# Patient Record
Sex: Female | Born: 1945 | Race: White | Hispanic: No | Marital: Married | Smoking: Former smoker
Health system: Southern US, Community
[De-identification: ages and names within clinical notes are randomized; demographics above are authoritative.]

## PROBLEM LIST (undated history)

## (undated) DIAGNOSIS — M1990 Unspecified osteoarthritis, unspecified site: Secondary | ICD-10-CM

## (undated) DIAGNOSIS — F32A Depression, unspecified: Secondary | ICD-10-CM

## (undated) DIAGNOSIS — G709 Myoneural disorder, unspecified: Secondary | ICD-10-CM

## (undated) DIAGNOSIS — F329 Major depressive disorder, single episode, unspecified: Secondary | ICD-10-CM

## (undated) DIAGNOSIS — F419 Anxiety disorder, unspecified: Secondary | ICD-10-CM

## (undated) DIAGNOSIS — R112 Nausea with vomiting, unspecified: Secondary | ICD-10-CM

## (undated) DIAGNOSIS — T148XXA Other injury of unspecified body region, initial encounter: Secondary | ICD-10-CM

## (undated) DIAGNOSIS — G4730 Sleep apnea, unspecified: Secondary | ICD-10-CM

## (undated) DIAGNOSIS — Z9889 Other specified postprocedural states: Secondary | ICD-10-CM

## (undated) DIAGNOSIS — J449 Chronic obstructive pulmonary disease, unspecified: Secondary | ICD-10-CM

## (undated) DIAGNOSIS — J302 Other seasonal allergic rhinitis: Secondary | ICD-10-CM

## (undated) DIAGNOSIS — F0390 Unspecified dementia without behavioral disturbance: Secondary | ICD-10-CM

## (undated) DIAGNOSIS — K219 Gastro-esophageal reflux disease without esophagitis: Secondary | ICD-10-CM

## (undated) DIAGNOSIS — Z23 Encounter for immunization: Secondary | ICD-10-CM

## (undated) DIAGNOSIS — K432 Incisional hernia without obstruction or gangrene: Secondary | ICD-10-CM

## (undated) DIAGNOSIS — I824Z9 Acute embolism and thrombosis of unspecified deep veins of unspecified distal lower extremity: Secondary | ICD-10-CM

## (undated) HISTORY — PX: APPENDECTOMY: SHX54

## (undated) HISTORY — PX: EYE SURGERY: SHX253

## (undated) HISTORY — PX: ABDOMINAL HYSTERECTOMY: SHX81

## (undated) HISTORY — PX: FOOT SURGERY: SHX648

## (undated) HISTORY — DX: Encounter for immunization: Z23

## (undated) HISTORY — DX: Other specified postprocedural states: Z98.890

## (undated) HISTORY — PX: HYSTERECTOMY: SHX81

## (undated) HISTORY — DX: Depression, unspecified: F32.A

## (undated) HISTORY — DX: Other seasonal allergic rhinitis: J30.2

## (undated) HISTORY — DX: Chronic obstructive pulmonary disease, unspecified: J44.9

## (undated) HISTORY — PX: APPENDECTOMY (OPEN): SHX54

## (undated) HISTORY — DX: Nausea with vomiting, unspecified: R11.2

## (undated) HISTORY — PX: COLONOSCOPY: SHX174

## (undated) HISTORY — DX: Unspecified osteoarthritis, unspecified site: M19.90

## (undated) HISTORY — PX: SPINE SURGERY: SHX786

## (undated) HISTORY — PX: HERNIA REPAIR: SHX51

## (undated) HISTORY — PX: OTHER SURGICAL HISTORY: SHX169

## (undated) HISTORY — DX: Acute embolism and thrombosis of unspecified deep veins of unspecified distal lower extremity: I82.4Z9

## (undated) HISTORY — DX: Gastro-esophageal reflux disease without esophagitis: K21.9

## (undated) HISTORY — DX: Unspecified dementia, unspecified severity, without behavioral disturbance, psychotic disturbance, mood disturbance, and anxiety: F03.90

## (undated) HISTORY — DX: Other injury of unspecified body region, initial encounter: T14.8XXA

## (undated) HISTORY — DX: Incisional hernia without obstruction or gangrene: K43.2

## (undated) HISTORY — DX: Sleep apnea, unspecified: G47.30

---

## 2017-04-03 HISTORY — PX: SPINE SURGERY: SHX786

## 2017-08-02 ENCOUNTER — Other Ambulatory Visit (INDEPENDENT_AMBULATORY_CARE_PROVIDER_SITE_OTHER): Payer: Medicare Other

## 2017-08-02 ENCOUNTER — Ambulatory Visit (INDEPENDENT_AMBULATORY_CARE_PROVIDER_SITE_OTHER): Payer: Medicare Other | Admitting: Internal Medicine

## 2017-08-02 ENCOUNTER — Encounter: Payer: Self-pay | Admitting: Internal Medicine

## 2017-08-02 VITALS — BP 118/68 | HR 76 | Ht 63.0 in | Wt 173.4 lb

## 2017-08-02 DIAGNOSIS — R05 Cough: Secondary | ICD-10-CM

## 2017-08-02 DIAGNOSIS — R058 Other specified cough: Secondary | ICD-10-CM

## 2017-08-02 LAB — BASIC METABOLIC PANEL
BUN: 19 mg/dL (ref 6–23)
CO2: 25 mEq/L (ref 19–32)
CREATININE: 1.12 mg/dL (ref 0.40–1.20)
Calcium: 8.8 mg/dL (ref 8.4–10.5)
Chloride: 105 mEq/L (ref 96–112)
GFR: 50.91 mL/min — AB (ref >60.00)
Glucose, Bld: 88 mg/dL (ref 70–99)
Potassium: 3.9 mEq/L (ref 3.5–5.1)
Sodium: 139 mEq/L (ref 135–145)

## 2017-08-02 LAB — CBC WITH DIFFERENTIAL/PLATELET
BASOS PCT: 0.9 % (ref 0.0–3.0)
Basophils Absolute: 0 10*3/uL (ref 0.0–0.1)
Eosinophils Absolute: 0.2 10*3/uL (ref 0.0–0.7)
Eosinophils Relative: 3.8 % (ref 0.0–5.0)
HEMATOCRIT: 40.4 % (ref 36.0–46.0)
Hemoglobin: 13.5 g/dL (ref 12.0–15.0)
LYMPHS PCT: 24 % (ref 12.0–46.0)
Lymphs Abs: 1.1 10*3/uL (ref 0.7–4.0)
MCHC: 33.3 g/dL (ref 30.0–36.0)
MCV: 89.8 fl (ref 78.0–100.0)
MONOS PCT: 8.9 % (ref 3.0–12.0)
Monocytes Absolute: 0.4 10*3/uL (ref 0.1–1.0)
NEUTROS ABS: 3 10*3/uL (ref 1.4–7.7)
Neutrophils Relative %: 62.4 % (ref 43.0–77.0)
PLATELETS: 309 10*3/uL (ref 150.0–400.0)
RBC: 4.5 Mil/uL (ref 3.87–5.11)
RDW: 13.2 % (ref 11.5–15.5)
WBC: 4.8 10*3/uL (ref 4.0–10.5)

## 2017-08-02 MED ORDER — FAMOTIDINE 20 MG PO TABS: ORAL_TABLET | ORAL | 11 refills | Status: DC

## 2017-08-02 MED ORDER — PANTOPRAZOLE SODIUM 40 MG PO TBEC: 40.0000 mg | DELAYED_RELEASE_TABLET | Freq: Every day | ORAL | 2 refills | Status: DC

## 2017-08-02 MED ORDER — PREDNISONE 10 MG PO TABS: ORAL_TABLET | ORAL | 0 refills | Status: DC

## 2017-08-02 MED ORDER — TRAMADOL HCL 50 MG PO TABS: 50.0000 mg | ORAL_TABLET | Freq: Four times a day (QID) | ORAL | 0 refills | Status: DC | PRN

## 2017-08-02 NOTE — Assessment & Plan Note (Signed)
Onset Jan 24/2019 p ET  - Allergy profile 08/02/2017 >  Eos 0. /  IgE    The most common causes of chronic cough in immunocompetent adults include the following: upper airway cough syndrome (UACS), previously referred to as postnasal drip syndrome (PNDS), which is caused by variety of rhinosinus conditions; (2) asthma; (3) GERD; (4) chronic bronchitis from cigarette smoking or other inhaled environmental irritants; (5) nonasthmatic eosinophilic bronchitis; and (6) bronchiectasis.   These conditions, singly or in combination, have accounted for up to 94% of the causes of chronic cough in prospective studies.   Other conditions have constituted no >6% of the causes in prospective studies These have included bronchogenic carcinoma, chronic interstitial pneumonia, sarcoidosis, left ventricular failure, ACEI-induced cough, and aspiration from a condition associated with pharyngeal dysfunction.    Chronic cough is often simultaneously caused by more than one condition. A single cause has been found from 38 to 82% of the time, multiple causes from 18 to 62%. Multiply caused cough has been the result of three diseases up to 42% of the time.       Onset p et c/w Upper airway cough syndrome (previously labeled PNDS),  is so named because it's frequently impossible to sort out how much is  CR/sinusitis with freq throat clearing (which can be related to primary GERD)   vs  causing  secondary (" extra esophageal")  GERD from wide swings in gastric pressure that occur with throat clearing, often  promoting self use of mint and menthol lozenges that reduce the lower esophageal sphincter tone and exacerbate the problem further in a cyclical fashion.   These are the same pts (now being labeled as having "irritable larynx syndrome" by some cough centers) who not infrequently have a history of having failed to tolerate ace inhibitors,  dry powder inhalers or biphosphonates or report having atypical/extraesophageal reflux  symptoms that don't respond to standard doses of PPI  and are easily confused as having aecopd or asthma flares by even experienced allergists/ pulmonologists (myself included).  Of the three most common causes of  Sub-acute / recurrent or chronic cough, only one (GERD)  can actually contribute to/ trigger  the other two (asthma and post nasal drip syndrome)  and perpetuate the cylce of cough.  While not intuitively obvious, many patients with chronic low grade reflux do not cough until there is a primary insult that disturbs the protective epithelial barrier and exposes sensitive nerve endings.   This is typically viral but can due to PNDS or ET tube and  Either of latter two  may apply here.     The point is that once this occurs, it is difficult to eliminate the cycle  using anything but a maximally effective acid suppression regimen at least in the short run, accompanied by an appropriate diet to address non acid GERD and control / eliminate the cough itself for at least 3 days with tramadol and if not better next step is ent eval vs trial of gabapentin.   Total time devoted to counseling  > 50 % of initial 60 min office visit:  review case with pt/ discussion of options/alternatives/ personally creating written customized instructions  in presence of pt  then going over those specific  Instructions directly with the pt including how to use all of the meds but in particular covering each new medication in detail and the difference between the maintenance= "automatic" meds and the prns using an action plan format for the latter (  If this problem/symptom => do that organization reading Left to right).  Please see AVS from this visit for a full list of these instructions which I personally wrote for this pt and  are unique to this visit.

## 2017-08-02 NOTE — Patient Instructions (Addendum)
The key to effective treatment for your cough is eliminating the non-stop cycle of cough you're stuck in long enough to let your airway heal completely and then see if there is anything still making you cough once you stop the cough suppression, but this should take no more than 5 days to figure out  First take delsym two tsp every 12 hours and supplement if needed with  tramadol 50 mg up to 2 every 4 hours to suppress the urge to cough at all or even clear your throat. Swallowing water or using ice chips/non mint and menthol containing candies (such as lifesavers or sugarless jolly ranchers) are also effective.  You should rest your voice and avoid activities that you know make you cough.  Once you have eliminated the cough for 3 straight days try reducing the tramadol first,  then the delsym as tolerated.    Prednisone 10 mg take  4 each am x 2 days,   2 each am x 2 days,  1 each am x 2 days and stop (this is to eliminate allergies and inflammation from coughing)  Protonix (pantoprazole) Take 30-60 min before first meal of the day and Pepcid 20 mg one bedtime plus chlorpheniramine 4 mg x 2 at bedtime (both available over the counter)  until cough is completely gone for at least a week without the need for cough suppression  For drainage / throat tickle try take CHLORPHENIRAMINE  4 mg - take one every 4 hours as needed - available over the counter- may cause drowsiness so start with just a bedtime dose or two and see how you tolerate it before trying in daytime    GERD (REFLUX)  is an extremely common cause of respiratory symptoms, many times with no significant heartburn at all.    It can be treated with medication, but also with lifestyle changes including avoidance of late meals, excessive alcohol, smoking cessation, and avoid fatty foods, chocolate, peppermint, colas, red wine, and acidic juices such as orange juice.  NO MINT OR MENTHOL PRODUCTS SO NO COUGH DROPS   USE HARD CANDY INSTEAD (jolley  ranchers or Stover's or Lifesavers (all available in sugarless versions) NO OIL BASED VITAMINS - use powdered substitutes.   Please remember to go to the lab department downstairs in the basement  for your tests - we will call you with the results when they are available.       Return in 2 weeks if not 100% better.

## 2017-08-02 NOTE — Progress Notes (Signed)
Subjective:     Patient ID: Becky Salazar, female   DOB: [birth year],    MRN: [id number]  HPI    56 yowf  Quit smoking 2006  With onset 50's  Rhinitis not seasonal rx otc s cough/ wheeze s/p surgery on R foot req [place] Apr 26 2017 with throat irritation ever since evolving into 24/7 dry hacking and 6 diffent encounters locally so  Referred herself to pulmonar clinic 08/02/2017       08/02/2017 1st [facility] office visit/ Carrine Kroboth   Chief Complaint  Patient presents with  . Pulmonary Consult    Self referral. Pt c/o cough x 3 months, non prod- occurs am and pm and she has had some trouble sleeping due to cough. She also c/o SOB with exertion such as walking up stairs. She was dxed approx 1 wk ago with DVT and was started on Eliquis.   acute onset cough p et Jan 24/19  rx prednisone/ saba/codeine  - no rx for gerd  Cough is 24/7 with urinary incont and gag but no vomiting  Not provoked by smells/ food/ temp changes or assoc wheeze R foot operation > L DVT one week prior to OV  With neg CTa  Not really sob unless coughing     No  obvious day to day or daytime variability or assoc excess/ purulent sputum or mucus plugs or hemoptysis or cp or chest tightness, subjective wheeze or overt sinus or hb symptoms. No unusual exposure hx or h/o childhood pna/ asthma or knowledge of premature birth.  Also denies any obvious fluctuation of symptoms with weather or environmental changes or other aggravating or alleviating factors except as outlined above   Current Allergies, Complete Past Medical History, Past Surgical History, Family History, and Social History were reviewed in [employer] record.  ROS  The following are not active complaints unless bolded Hoarseness, sore throat, dysphagia, dental problems, itching, sneezing,  nasal congestion or discharge of excess mucus or purulent secretions, ear ache,   fever, chills, sweats, unintended wt loss or wt gain, classically  pleuritic or exertional cp,  orthopnea pnd or leg swelling, presyncope, palpitations, abdominal pain, anorexia, nausea, vomiting, diarrhea  or change in bowel habits or change in bladder habits, change in stools or change in urine, dysuria, hematuria,  rash, arthralgias, visual complaints, headache, numbness, weakness or ataxia or problems with walking or coordination,  change in mood/affect or memory.        Current Meds  Medication Sig  . albuterol (PROVENTIL HFA;VENTOLIN HFA) 108 (90 Base) MCG/ACT inhaler Inhale 2 puffs into the lungs every 6 (six) hours as needed for wheezing or shortness of breath.  Marland Kitchen apixaban (ELIQUIS) 5 MG TABS tablet Take 5 mg by mouth 2 (two) times daily.  . benzonatate (TESSALON) 100 MG capsule Take 100 mg by mouth 3 (three) times daily as needed for cough.  . Cholecalciferol (VITAMIN D) 2000 units CAPS Take 1 capsule by mouth daily.  Marland Kitchen estradiol (ESTRACE) 2 MG tablet Take 2 mg by mouth daily.  . fluticasone (FLONASE) 50 MCG/ACT nasal spray Place 2 sprays into both nostrils daily as needed for allergies or rhinitis.  Marland Kitchen HYDROcodone-homatropine (HYCODAN) 5-1.5 MG/5ML syrup Take 5 mLs by mouth every 6 (six) hours as needed for cough.  . pravastatin (PRAVACHOL) 40 MG tablet Take 40 mg by mouth daily.  . traMADol (ULTRAM) 50 MG tablet Take 50 mg by mouth every 6 (six) hours as needed.  Review of Systems     Objective:   Physical Exam    amb wf with honking cough   Wt Readings from Last 3 Encounters:  08/02/17 173 lb 6.4 oz (78.7 kg)     Vital signs reviewed - Note on arrival 02 sats  95% on RA   HEENT: nl dentition, turbinates bilaterally, and oropharynx. Nl external ear canals without cough reflex   NECK :  without JVD/Nodes/TM/ nl carotid upstrokes bilaterally   LUNGS: no acc muscle use,  Nl contour chest which is clear to A and P bilaterally without cough on insp or exp maneuvers   CV:  RRR  no s3 or murmur or increase in P2, and  LLE   1+ pitting   ABD:  soft and nontender with nl inspiratory excursion in the supine position. No bruits or organomegaly appreciated, bowel sounds nl  MS:  Nl gait/ ext warm without deformities, calf tenderness, cyanosis or clubbing No obvious joint restrictions   SKIN: warm and dry without lesions    NEURO:  alert, approp, nl sensorium with  no motor or cerebellar deficits apparent.       I personally reviewed images and agree with radiology impression as follows:   Chest CTa  07/2917  Nl    Labs ordered 08/02/2017  Allergy profile    Assessment:

## 2017-08-03 LAB — RESPIRATORY ALLERGY PROFILE REGION II ~~LOC~~
Allergen, Cedar tree, t12: <0.1 kU/L
Allergen, D pternoyssinus,d7: <0.1 kU/L
Allergen, Mouse Urine Protein, e78: <0.1 kU/L
Allergen, Mulberry, t76: <0.1 kU/L
Allergen, Oak,t7: <0.1 kU/L
Bermuda Grass: <0.1 kU/L
CLASS: 0
CLASS: 0
CLASS: 0
CLASS: 0
CLASS: 0
CLASS: 0
CLASS: 0
CLASS: 0
CLASS: 0
CLASS: 0
CLASS: 0
Cat Dander: <0.1 kU/L
Class: 0
Class: 0
Class: 0
Class: 0
Class: 0
Class: 0
Class: 0
Class: 0
Class: 0
Class: 0
Class: 0
Class: 0
Class: 0
Cockroach: <0.1 kU/L
D. farinae: <0.1 kU/L
IgE (Immunoglobulin E), Serum: <2 kU/L (ref <=114)
Sheep Sorrel IgE: <0.1 kU/L
Timothy Grass: <0.1 kU/L

## 2017-08-03 LAB — INTERPRETATION:

## 2017-08-17 ENCOUNTER — Ambulatory Visit (INDEPENDENT_AMBULATORY_CARE_PROVIDER_SITE_OTHER): Payer: Medicare Other | Admitting: Internal Medicine

## 2017-08-17 ENCOUNTER — Encounter: Payer: Self-pay | Admitting: Internal Medicine

## 2017-08-17 VITALS — BP 126/80 | HR 97 | Ht 63.0 in | Wt 172.8 lb

## 2017-08-17 DIAGNOSIS — R05 Cough: Secondary | ICD-10-CM | POA: Diagnosis not present

## 2017-08-17 DIAGNOSIS — R058 Other specified cough: Secondary | ICD-10-CM

## 2017-08-17 NOTE — Progress Notes (Signed)
Subjective:     Patient ID: Becky Salazar, female   DOB: Jul 09, 1945,    MRN: [id number]    Brief patient profile:  75 yowf  Quit smoking 2006  With onset 50's  Rhinitis not seasonal rx otc s cough/ wheeze s/p [place] on R foot req [place] Apr 26 2017 with throat irritation ever since evolving into 24/7 dry hacking and 6 diffent encounters locally so  Referred herself to pulmonary clinic 08/02/2017     W/in a month of surgery on R foot 04/26/17 , developed a blood clot L leg > rx eliquis since by PCP with neg CTa since then     08/02/2017 1st [facility] office visit/ Becky Salazar   Chief Complaint  Patient presents with  . Pulmonary Consult    Self referral. Pt c/o cough x 3 months, non prod- occurs am and pm and she has had some trouble sleeping due to cough. She also c/o SOB with exertion such as walking up stairs. She was dxed approx 1 wk ago with DVT and was started on Eliquis.   acute onset cough p et Jan 24 19  rx prednisone/ saba/codeine  - no rx for gerd  Cough is 24/7 with urinary incont and gag but no vomiting  Not provoked by smells/ food/ temp changes or assoc wheeze Not really sob unless coughing  rec   First take delsym two tsp every 12 hours and supplement if needed with  tramadol 50 mg up to 2 every 4 hours to suppress the urge to cough at all or even clear your throat. Once you have eliminated the cough for 3 straight days try reducing the tramadol first,  then the delsym as tolerated.   Prednisone 10 mg take  4 each am x 2 days,   2 each am x 2 days,  1 each am x 2 days and stop (this is to eliminate allergies and inflammation from coughing) Protonix (pantoprazole) Take 30-60 min before first meal of the day and Pepcid 20 mg one bedtime plus chlorpheniramine 4 mg x 2 at bedtime (both available over the counter)  until cough is completely gone for at least a week without the need for cough suppression For drainage / throat tickle try take CHLORPHENIRAMINE  4 mg - take one every 4  hours as needed - GERD (REFLUX) diet    08/17/2017  f/u ov/Becky Salazar re: uacs / cyclical cough p et  Chief Complaint  Patient presents with  . Follow-up    cough and SOB have improved some but have not completely resolved.    Dyspnea:  Doe x across parking lot  About the same time L hip gives out  Cough: overall  much better - worse at just at hs and not taking h1 as rec   Sleep: ok once gets to sleep  SABA :   Using surreptitiously 3 x daily (not sure what it is/ did not disclose last ov)/ finishing medrol for hip pain  Never used more than 2 tramadol in 24 h / admits to using lots of mints against recs on diet restriction    No obvious day to day or daytime variability or assoc excess/ purulent sputum or mucus plugs or hemoptysis or cp or chest tightness, subjective wheeze or overt sinus or hb symptoms. No unusual exposure hx or h/o childhood pna/ asthma or knowledge of premature birth.  Sleeping ok once she can get to sleep  without nocturnal  or early am exacerbation  of  respiratory  c/o's or need for noct saba. Also denies any obvious fluctuation of symptoms with weather or environmental changes or other aggravating or alleviating factors except as outlined above   Current Allergies, Complete Past Medical History, Past Surgical History, Family History, and Social History were reviewed in [employer] record.  ROS  The following are not active complaints unless bolded Hoarseness, sore throat, dysphagia, dental problems, itching, sneezing,  nasal congestion or discharge of excess mucus or purulent secretions, ear ache,   fever, chills, sweats, unintended wt loss or wt gain, classically pleuritic or exertional cp,  orthopnea pnd or arm/hand swelling  or leg swelling, presyncope, palpitations, abdominal pain, anorexia, nausea, vomiting, diarrhea  or change in bowel habits or change in bladder habits, change in stools or change in urine, dysuria, hematuria,  rash, arthralgias  L hip, visual complaints, headache, numbness, weakness or ataxia or problems with walking or coordination,  change in mood or  memory.        Current Meds  Medication Sig  . apixaban (ELIQUIS) 5 MG TABS tablet Take 5 mg by mouth 2 (two) times daily.  . Cholecalciferol (VITAMIN D) 2000 units CAPS Take 1 capsule by mouth daily.  Marland Kitchen estradiol (ESTRACE) 2 MG tablet Take 2 mg by mouth daily.  . famotidine (PEPCID) 20 MG tablet One at bedtime  . methylPREDNISolone (MEDROL DOSEPAK) 4 MG TBPK tablet Take as directed  . pantoprazole (PROTONIX) 40 MG tablet Take 1 tablet (40 mg total) by mouth daily. Take 30-60 min before first meal of the day  . pravastatin (PRAVACHOL) 40 MG tablet Take 40 mg by mouth daily.  . traMADol (ULTRAM) 50 MG tablet Take 1 tablet (50 mg total) by mouth every 6 (six) hours as needed.                     Objective:   Physical Exam     hoarse amb wf nad / no longer honking cough   Wt Readings from Last 3 Encounters:  08/17/17 172 lb 12.8 oz (78.4 kg)  08/02/17 173 lb 6.4 oz (78.7 kg)     Vital signs reviewed - Note on arrival 02 sats  96% on RA          LLE  1+ pitting on L > R  Neg slr    HEENT: nl dentition, turbinates bilaterally, and oropharynx. Nl external ear canals without cough reflex   NECK :  without JVD/Nodes/TM/ nl carotid upstrokes bilaterally   LUNGS: no acc muscle use,  Nl contour chest which is clear to A and P bilaterally without cough on insp or exp maneuvers   CV:  RRR  no s3 or murmur or increase in P2, and  Pitting edema   L >> R LE  ABD:  soft and nontender with nl inspiratory excursion in the supine position. No bruits or organomegaly appreciated, bowel sounds nl  MS:  Nl gait/ ext warm without deformities, calf tenderness, cyanosis or clubbing Neg SLR on L/ some restriction rotation of L hip    SKIN: warm and dry without lesions    NEURO:  alert, approp, nl sensorium with  no motor or cerebellar deficits apparent.            I personally reviewed images and agree with radiology impression as follows:   Chest CTa  07/30/17  Nl        Assessment:

## 2017-08-17 NOTE — Patient Instructions (Addendum)
Finish your medrol dose pack  For drainage / throat tickle/ coughing at bedtime  try take CHLORPHENIRAMINE  4 mg - take one every 4 hours as needed - available over the counter- may cause drowsiness so start with just   dose or two an hour  before bed and see how you tolerate it before trying in daytime    Then if still coughing >  Take delsym two tsp every 12 hours and supplement if needed with  tramadol 50 mg up to 2 every 4 hours to suppress the urge to cough. Swallowing water and/or using ice chips/non mint and menthol containing candies (such as lifesavers or sugarless jolly ranchers) are also effective.  You should rest your voice and avoid activities that you know make you cough.  Once you have eliminated the cough for 3 straight days try reducing the tramadol first,  then the delsym as tolerated.     Stop inhaler and all mint/ menthol / chocolate   If not happy please return with all medications in hand

## 2017-08-18 ENCOUNTER — Encounter: Payer: Self-pay | Admitting: Internal Medicine

## 2017-08-18 NOTE — Assessment & Plan Note (Signed)
Onset Jan 24/2019 p ET  - Allergy profile 08/02/2017 >  Eos 0.2 /  IgE  < 2 RAST neg  Despite multiple errors in following the cyclical cough protocol she has improved x for the cough at hs which is likely related to gerd or pnds and should try 1st gen H1 blockers per guidelines  Before considering additional rx  If not better first needs to return with all meds in hand using a trust but verify approach to confirm accurate Medication  Reconciliation The principal here is that until we are certain that the  patients are doing what we've asked, it makes no sense to ask them to do more.   Pulmonary f/u can be prn

## 2017-12-20 ENCOUNTER — Other Ambulatory Visit: Payer: Self-pay | Admitting: Neurosurgery

## 2017-12-31 NOTE — Pre-Procedure Instructions (Signed)
Becky Salazar  12/31/2017      [facility] - [place], [place] - [address] [place] [address] Phone: [phone] Fax: [phone]    Your procedure is scheduled on Oct. 11  Report to [facility] Admitting at 10:30  A.M.  Call this number if you have problems the morning of surgery:  [phone]   Remember:  Do not eat or drink after midnight.      Take these medicines the morning of surgery with A SIP OF WATER :              Gabapentin (neurontin)             oxycodone if needed              sertraline (zoloft)                  7 days prior to surgery STOP taking any Aspirin(unless otherwise instructed by your surgeon), Aleve, Naproxen, Ibuprofen, Motrin, Advil, Goody's, BC's, all herbal medications, fish oil, and all vitamins    Do not wear jewelry, make-up or nail polish.  Do not wear lotions, powders, or perfumes, or deodorant.  Do not shave 48 hours prior to surgery.  Men may shave face and neck.  Do not bring valuables to the hospital.  [facility] is not responsible for any belongings or valuables.  Contacts, dentures or bridgework may not be worn into surgery.  Leave your suitcase in the car.  After surgery it may be brought to your room.  For patients admitted to the hospital, discharge time will be determined by your treatment team.  Patients discharged the day of surgery will not be allowed to drive home.    Special instructions:  [facility]- Preparing For Surgery  Before surgery, you can play an important role. Because skin is not sterile, your skin needs to be as free of germs as possible. You can reduce the number of germs on your skin by washing with CHG (chlorahexidine gluconate) Soap before surgery.  CHG is an antiseptic cleaner which kills germs and bonds with the skin to continue killing germs even after washing.    Oral Hygiene is also important to reduce your risk of infection.  Remember - BRUSH YOUR  TEETH THE MORNING OF SURGERY WITH YOUR REGULAR TOOTHPASTE  Please do not use if you have an allergy to CHG or antibacterial soaps. If your skin becomes reddened/irritated stop using the CHG.  Do not shave (including legs and underarms) for at least 48 hours prior to first CHG shower. It is OK to shave your face.  Please follow these instructions carefully.   1. Shower the NIGHT BEFORE SURGERY and the MORNING OF SURGERY with CHG.   2. If you chose to wash your hair, wash your hair first as usual with your normal shampoo.  3. After you shampoo, rinse your hair and body thoroughly to remove the shampoo.  4. Use CHG as you would any other liquid soap. You can apply CHG directly to the skin and wash gently with a scrungie or a clean washcloth.   5. Apply the CHG Soap to your body ONLY FROM THE NECK DOWN.  Do not use on open wounds or open sores. Avoid contact with your eyes, ears, mouth and genitals (private parts). Wash Face and genitals (private parts)  with your normal soap.  6. Wash thoroughly, paying special attention to the area where your  surgery will be performed.  7. Thoroughly rinse your body with warm water from the neck down.  8. DO NOT shower/wash with your normal soap after using and rinsing off the CHG Soap.  9. Pat yourself dry with a CLEAN TOWEL.  10. Wear CLEAN PAJAMAS to bed the night before surgery, wear comfortable clothes the morning of surgery  11. Place CLEAN SHEETS on your bed the night of your first shower and DO NOT SLEEP WITH PETS.    Day of Surgery:  Do not apply any deodorants/lotions.  Please wear clean clothes to the hospital/surgery center.   Remember to brush your teeth WITH YOUR REGULAR TOOTHPASTE.    Please read over the following fact sheets that you were given. Coughing and Deep Breathing, MRSA Information and Surgical Site Infection Prevention

## 2018-01-01 ENCOUNTER — Encounter (HOSPITAL_COMMUNITY)
Admission: RE | Admit: 2018-01-01 | Discharge: 2018-01-01 | Disposition: A | Discharge status: Home / Self Care | Payer: Medicare Other | Source: Ambulatory Visit | Attending: Neurosurgery | Admitting: Neurosurgery

## 2018-01-01 ENCOUNTER — Encounter (HOSPITAL_COMMUNITY): Payer: Self-pay

## 2018-01-01 ENCOUNTER — Other Ambulatory Visit: Payer: Self-pay

## 2018-01-01 DIAGNOSIS — Z01818 Encounter for other preprocedural examination: Secondary | ICD-10-CM | POA: Insufficient documentation

## 2018-01-01 DIAGNOSIS — M48062 Spinal stenosis, lumbar region with neurogenic claudication: Secondary | ICD-10-CM | POA: Insufficient documentation

## 2018-01-01 HISTORY — DX: Anxiety disorder, unspecified: F41.9

## 2018-01-01 HISTORY — DX: Unspecified osteoarthritis, unspecified site: M19.90

## 2018-01-01 HISTORY — DX: Major depressive disorder, single episode, unspecified: F32.9

## 2018-01-01 HISTORY — DX: Myoneural disorder, unspecified: G70.9

## 2018-01-01 HISTORY — DX: Depression, unspecified: F32.A

## 2018-01-01 LAB — CBC
HCT: 42.9 % (ref 36.0–46.0)
Hemoglobin: 13.5 g/dL (ref 12.0–15.0)
MCH: 29.7 pg (ref 26.0–34.0)
MCHC: 31.5 g/dL (ref 30.0–36.0)
MCV: 94.5 fL (ref 78.0–100.0)
Platelets: 229 10*3/uL (ref 150–400)
RBC: 4.54 MIL/uL (ref 3.87–5.11)
RDW: 13.9 % (ref 11.5–15.5)
WBC: 6.8 10*3/uL (ref 4.0–10.5)

## 2018-01-01 LAB — TYPE AND SCREEN
ABO/RH(D): A POS
Antibody Screen: NEGATIVE

## 2018-01-01 LAB — PROTIME-INR
INR: 1.14
Prothrombin Time: 14.5 seconds (ref 11.4–15.2)

## 2018-01-01 LAB — SURGICAL PCR SCREEN
MRSA, PCR: NEGATIVE
Staphylococcus aureus: NEGATIVE

## 2018-01-01 LAB — BASIC METABOLIC PANEL
Anion gap: 10 (ref 5–15)
BUN: 25 mg/dL — AB (ref 8–23)
CALCIUM: 9.8 mg/dL (ref 8.9–10.3)
CO2: 27 mmol/L (ref 22–32)
CREATININE: 1.13 mg/dL — AB (ref 0.44–1.00)
Chloride: 105 mmol/L (ref 98–111)
GFR calc non Af Amer: 48 mL/min — ABNORMAL LOW (ref >60)
GFR, EST AFRICAN AMERICAN: 55 mL/min — AB (ref >60)
GLUCOSE: 102 mg/dL — AB (ref 70–99)
Potassium: 4.6 mmol/L (ref 3.5–5.1)
Sodium: 142 mmol/L (ref 135–145)

## 2018-01-01 LAB — ABO/RH: ABO/RH(D): A POS

## 2018-01-01 NOTE — Progress Notes (Addendum)
PCP: Faythe Casa, MD  Cardiologist: pt denies -has had cardiac studies done at [facility] at [place]  EKG: requested  Stress test: requested  ECHO: pt denies  Cardiac Cath: pt denies  Chest x-ray: pt denies past year, no recent respiratory infections  Eliquis-last dose 01/07/18 per MD instructions

## 2018-01-02 NOTE — Progress Notes (Signed)
Anesthesia Chart Review:  Case:  [id number] Date/Time:  01/11/18 1214   Procedure:  Left Lumbar 2-3 Anterolateral lumbar interbody fusion with lateral plate (Left ) - Left Lumbar 2-3 Anterolateral lumbar interbody fusion with lateral plate   Anesthesia type:  General   Pre-op diagnosis:  Lumbar stenosis with neurogenic claudication   Location:  [facility] OR ROOM 21 / [facility] OR   Surgeon:  Maeola Harman, MD      DISCUSSION: 72 yo female former smoker. 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  Pt has cardiac clearance from Dr. Christoper Allegra 12/28/2017, copy on pt chart. Per his note "patient is low cardiac risk for the lumbar surgery.  She may proceed to the surgery without any further evaluation.  She may discontinue her anticoagulation 3 days prior to the procedure and resume 2 days after.  Patient has risk for postoperative DVTs given her current DVT in her left femoral vein... Patient has also underwent a recent nuclear medicine stress test for foot procedure.  This has returned negative.  She denies having any recent chest pain shortness of breath.  She has no personal history of heart disease, peripheral arterial disease, or stroke.  Her kidney function is borderline stage II-III.  Her last creatinine was returned back at 1.0.  Patient performed less than 4 METS due to pain in her back and left hip."  Anticipate she can proceed as planned barring acute status change.  VS: BP 123/75   Pulse 71   Temp 36.5 C   Resp 20   Ht 5\' 3"  (1.6 m)   Wt 80.2 kg   SpO2 100%   BMI 31.32 kg/m   PROVIDERS: System, Pcp Not In  Christoper Allegra, MD is Cardiologist with [facility] in [place], last seen 12/28/2017.  LABS: Labs reviewed: Acceptable for surgery. (all labs ordered are listed, but only abnormal results are displayed)  Labs Reviewed  BASIC METABOLIC PANEL - Abnormal; Notable for the following components:   Result Value   Glucose, Bld 102 (*)    BUN 25 (*)    Creatinine, Ser 1.13 (*)    GFR calc non Af Amer 48 (*)    GFR calc Af Amer 55 (*)    All other components within normal limits  SURGICAL PCR SCREEN  CBC  PROTIME-INR  TYPE AND SCREEN  ABO/RH     IMAGES: CTA chest 07/30/2017: Impression: 1.  No pulmonary embolus. 2.  Small pericardial effusion. 3.  Right thyroid nodule, cystic measuring 3.7 cm. 4.  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   EKG: Requested, if not received will need on DOS   CV:  05/03/2011 (outside record, copy on pt chart): Summary: 1.  Stress ECG conclusions: Stress ECG is negative for ischemia. 2.  Myocardial perfusion imaging: No myocardial perfusion defects noted. 3.  Gated SPECT: The cochlea left ventricular ejection fraction 70%.  Past Medical History:  Diagnosis Date  . Anxiety   . Arthritis   . Depression   . Neuromuscular disorder [facility])     Past Surgical History:  Procedure Laterality Date  . ABDOMINAL HYSTERECTOMY    . APPENDECTOMY    . EYE SURGERY Right    cataract sugery  . FOOT SURGERY     x3-straightening toes    MEDICATIONS: . amitriptyline (ELAVIL) 25 MG tablet  . apixaban (ELIQUIS) 5 MG TABS tablet  . Cholecalciferol (VITAMIN D) 2000 units CAPS  .  donepezil (ARICEPT) 5 MG tablet  . gabapentin (NEURONTIN) 400 MG capsule  . oxyCODONE-acetaminophen (PERCOCET/ROXICET) 5-325 MG tablet  . pravastatin (PRAVACHOL) 40 MG tablet  . sertraline (ZOLOFT) 50 MG tablet  . vitamin B-12 (CYANOCOBALAMIN) 1000 MCG tablet   No current facility-administered medications for this encounter.      Zannie Cove [facility] Short Stay Center/Anesthesiology Phone [phone] 01/04/2018 4:10 PM

## 2018-01-08 NOTE — H&P (Signed)
Patient ID:   [id number] Patient: Becky Salazar  Date of Birth: 03/08/1946 Visit Type: Office Visit   Date: 12/14/2017 02:15 PM Provider: Danae Orleans. Venetia Maxon MD   This 72 year old female presents for back pain.  HISTORY OF PRESENT ILLNESS:  1.  back pain  Becky Salazar, 73 year old retired female and wife of patient Becky Salazar, visits for evaluation.  She reports increasing low back pain since January.  She recalls no injury.  She was apparently diagnosed with a left leg DVT 3 months ago and placed on Eliquis.  She does not recall any symptoms prompting evaluation for DVT; but is scheduled to follow-up with that physician soon.  Gabapentin 400 mg t.i.d. Oxycodone 5 mg typically once per day  Physical therapy x3 courses offered no relief ESI's x2 offered no relief  History:  Left leg DVT 3 months ago, arthritis, depression Surgical history:  Appendectomy 1954, hysterectomy 1994, eye cancer, sacral nerve stimulator placed approximately 10 years ago, repositioned and IPG changed approximately 5 years ago.  CT and x-rays on [place]          PAST MEDICAL/SURGICAL HISTORY:   (Detailed)       PAST MEDICAL HISTORY, SURGICAL HISTORY, FAMILY HISTORY, SOCIAL HISTORY AND REVIEW OF SYSTEMS I have reviewed the patient's past medical, surgical, family and social history as well as the comprehensive review of systems as included on the [facility] history form dated 12/14/2017, which I have signed.  Family History:  (Detailed)   Social History:  (Detailed) Tobacco use reviewed. Preferred language is [place].   Tobacco use status: Current non-smoker. Smoking status: Never smoker.  SMOKING STATUS Type Smoking Status Usage Per Day Years Used Total Pack Years   Never smoker      TOBACCO/VAPING EXPOSURE No passive vaping exposure. No passive smoke exposure.       MEDICATIONS: (added, continued or stopped this visit) Started Medication  Directions Instruction Stopped  12/14/2017 Percocet 5 mg-325 mg tablet take 1 tablet by oral route every 4-6 hours prn pain       ALLERGIES: Ingredient Reaction Medication Name Comment  0.225 % SODIUM CHLORIDE  Tobi   TOBRAMYCIN  Tobi   PENICILLIN      Reviewed, updated.   REVIEW OF SYSTEMS   See scanned patient registration form, dated 12/14/2017, signed and dated on 12/15/2017  Review of Systems Details System Neg/Pos Details  Constitutional Negative Chills, Fatigue, Fever, Malaise, Night sweats, Weight gain and Weight loss.  ENMT Negative Ear drainage, Hearing loss, Nasal drainage, Otalgia, Sinus pressure and Sore throat.  Eyes Negative Eye discharge, Eye pain and Vision changes.  Respiratory Negative Chronic cough, Cough, Dyspnea, Known TB exposure and Wheezing.  Cardio Negative Chest pain, Claudication, Edema and Irregular heartbeat/palpitations.  GI Negative Abdominal pain, Blood in stool, Change in stool pattern, Constipation, Decreased appetite, Diarrhea, Heartburn, Nausea and Vomiting.  GU Negative Dysuria, Hematuria, Polyuria (Genitourinary), Urinary frequency, Urinary incontinence and Urinary retention.  Endocrine Negative Cold intolerance, Heat intolerance, Polydipsia and Polyphagia.  Neuro Negative Dizziness, Extremity weakness, Gait disturbance, Headache, Memory impairment, Numbness in extremity, Seizures and Tremors.  Psych Negative Anxiety, Depression and Insomnia.  Integumentary Negative Brittle hair, Brittle nails, Change in shape/size of mole(s), Hair loss, Hirsutism, Hives, Pruritus, Rash and Skin lesion.  MS Positive Back pain.  Hema/Lymph Negative Easy bleeding, Easy bruising and Lymphadenopathy.  Allergic/Immuno Negative Contact allergy, Environmental allergies, Food allergies and Seasonal allergies.  Reproductive Negative Breast discharge, Breast lumps, Dysmenorrhea, Dyspareunia, History of  abnormal PAP smear, Hot flashes, Irregular menses and Vaginal  discharge.   PHYSICAL EXAM:   Vitals Date Temp F BP Pulse Ht In Wt Lb BMI BSA Pain Score  12/14/2017  111/81 81 63 172 30.47  4/10    PHYSICAL EXAM Details General Level of Distress: no acute distress Overall Appearance: normal  Head and Face  Right Left  Fundoscopic Exam:  normal normal    Cardiovascular Cardiac: regular rate and rhythm without murmur  Right Left  Carotid Pulses: normal normal  Respiratory Lungs: clear to auscultation  Neurological Orientation: normal Recent and Remote Memory: normal Attention Span and Concentration:   normal Language: normal Fund of Knowledge: normal  Right Left Sensation: normal normal Upper Extremity Coordination: normal normal  Lower Extremity Coordination: normal normal  Musculoskeletal Gait and Station: normal  Right Left Upper Extremity Muscle Strength: normal normal Lower Extremity Muscle Strength: normal normal Upper Extremity Muscle Tone:  normal normal Lower Extremity Muscle Tone: normal normal   Motor Strength Upper and lower extremity motor strength was tested in the clinically pertinent muscles. Any abnormal findings will be noted below.   Right Left Hip Flexor:  4/5 Knee Extensor:  4/5   Deep Tendon Reflexes  Right Left Biceps: normal normal Triceps: normal normal Brachioradialis: normal normal Patellar: normal normal Achilles: normal normal  Sensory Sensation was tested at L1 to S1.   Cranial Nerves II. Optic Nerve/Visual Fields: normal III. Oculomotor: normal IV. Trochlear: normal V. Trigeminal: normal VI. Abducens: normal VII. Facial: normal VIII. Acoustic/Vestibular: normal IX. Glossopharyngeal: normal X. Vagus: normal XI. Spinal Accessory: normal XII. Hypoglossal: normal  Motor and other Tests Lhermittes: negative Rhomberg: negative Pronator drift: absent     Right Left Hoffman's: normal normal Clonus: normal normal Babinski: normal normal SLR: negative positive at 30  degrees Patrick's Pearlean Brownie): negative negative Toe Walk: normal normal Toe Lift: normal normal Heel Walk: normal normal SI Joint: nontender nontender   Additional Findings:  Deformity of right foot 2nd toe after surgery.  Left sciatic notch discomfort and left leg pain radiating into her left upper thigh.    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  She has weakness in left hip flexors at 4/5 and left quadriceps at 4/5 and is having continuous and unrelenting pain into her left leg with positive straight leg raise.  Comments:  Patient will need surgical clearance and need to stop Eliquis (Dr. Brent Bulla)  PLAN:  The patient wants to get some relief.  I have suggested left-sided L2-3 XL IF with lateral plate at cone.  She will need to be cleared medically and her DVT to be resolved and for her to come off Eliquis before proceeding with surgery.  Risks and benefits were discussed in detail with the patient and she wishes to proceed.  Orders: Diagnostic Procedures: Assessment Procedure  M54.16 Lumbar Spine- AP/Lat  M54.16 Lumbar Spine- AP/Lat/Flex/Ex  Miscellaneous: Assessment   M48.062 LSO Brace   Completed Orders (this encounter) Order Details Reason Side Interpretation Result Initial Treatment Date Region  Lumbar Spine- AP/Lat/Flex/Ex      12/14/2017 All Levels to All Levels   Assessment/Plan   # Detail Type Description   1. Assessment Disc displacement, lumbar (M51.26).       2. 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       3. Assessment Low back pain,  unspecified back pain laterality, with sciatica presence unspecified (M54.5).       4. Assessment Spondylolisthesis, lumbar region (M43.16).       5. Assessment Lumbar radiculopathy (M54.16).       6. Assessment Lumbar stenosis with neurogenic claudication  (M48.062).   Plan Orders LSO Brace. Clinical information/comments: gave paper order.         Pain Management Plan Pain Scale: 4/10. Method: Numeric Pain Intensity Scale. Location: back. Onset: 04/15/2017. Duration: varies. Quality: discomforting. Pain management follow-up plan of care: Patient taken medication as prescribed.     MEDICATIONS PRESCRIBED TODAY    Rx Quantity Refills  PERCOCET 5 mg-325 mg  60 0            Provider:  Danae Orleans. Venetia Maxon MD  12/15/2017 04:12 PM Dictation edited by: Danae Orleans. Venetia Maxon    CC Providers: Maeola Harman MD  [address] [place], [place] [address]               Electronically signed by Danae Orleans. Venetia Maxon MD on 12/15/2017 04:12 PM

## 2018-01-11 ENCOUNTER — Inpatient Hospital Stay (HOSPITAL_COMMUNITY): Payer: Medicare Other

## 2018-01-11 ENCOUNTER — Inpatient Hospital Stay (HOSPITAL_COMMUNITY)
Admission: RE | Admit: 2018-01-11 | Discharge: 2018-01-13 | DRG: 460 | Disposition: A | Discharge status: Home / Self Care | Payer: Medicare Other | Attending: Neurosurgery | Admitting: Neurosurgery

## 2018-01-11 ENCOUNTER — Encounter (HOSPITAL_COMMUNITY): Payer: Self-pay | Admitting: Certified Registered Nurse Anesthetist

## 2018-01-11 ENCOUNTER — Inpatient Hospital Stay (HOSPITAL_COMMUNITY): Payer: Medicare Other | Admitting: Physician Assistant

## 2018-01-11 ENCOUNTER — Encounter (HOSPITAL_COMMUNITY)
Admission: RE | Disposition: A | Discharge status: Home / Self Care | Payer: Self-pay | Source: Home / Self Care | Attending: Neurosurgery

## 2018-01-11 ENCOUNTER — Other Ambulatory Visit: Payer: Self-pay

## 2018-01-11 ENCOUNTER — Inpatient Hospital Stay (HOSPITAL_COMMUNITY): Payer: Medicare Other | Admitting: Certified Registered Nurse Anesthetist

## 2018-01-11 DIAGNOSIS — M1990 Unspecified osteoarthritis, unspecified site: Secondary | ICD-10-CM | POA: Diagnosis present

## 2018-01-11 DIAGNOSIS — Z85840 Personal history of malignant neoplasm of eye: Secondary | ICD-10-CM

## 2018-01-11 DIAGNOSIS — Z86718 Personal history of other venous thrombosis and embolism: Secondary | ICD-10-CM | POA: Diagnosis not present

## 2018-01-11 DIAGNOSIS — F329 Major depressive disorder, single episode, unspecified: Secondary | ICD-10-CM | POA: Diagnosis present

## 2018-01-11 DIAGNOSIS — M419 Scoliosis, unspecified: Secondary | ICD-10-CM | POA: Diagnosis present

## 2018-01-11 DIAGNOSIS — Z9682 Presence of neurostimulator: Secondary | ICD-10-CM

## 2018-01-11 DIAGNOSIS — Z881 Allergy status to other antibiotic agents status: Secondary | ICD-10-CM | POA: Diagnosis not present

## 2018-01-11 DIAGNOSIS — M4126 Other idiopathic scoliosis, lumbar region: Secondary | ICD-10-CM | POA: Diagnosis present

## 2018-01-11 DIAGNOSIS — M4316 Spondylolisthesis, lumbar region: Secondary | ICD-10-CM | POA: Diagnosis present

## 2018-01-11 DIAGNOSIS — Z90710 Acquired absence of both cervix and uterus: Secondary | ICD-10-CM | POA: Diagnosis not present

## 2018-01-11 DIAGNOSIS — M48062 Spinal stenosis, lumbar region with neurogenic claudication: Principal | ICD-10-CM | POA: Diagnosis present

## 2018-01-11 DIAGNOSIS — M4726 Other spondylosis with radiculopathy, lumbar region: Secondary | ICD-10-CM | POA: Diagnosis present

## 2018-01-11 DIAGNOSIS — Z87891 Personal history of nicotine dependence: Secondary | ICD-10-CM

## 2018-01-11 DIAGNOSIS — Z880 Allergy status to penicillin: Secondary | ICD-10-CM

## 2018-01-11 HISTORY — PX: ANTERIOR LAT LUMBAR FUSION: SHX1168

## 2018-01-11 SURGERY — ANTERIOR LATERAL LUMBAR FUSION 1 LEVEL
Anesthesia: General | Laterality: Left

## 2018-01-11 MED ORDER — SUCCINYLCHOLINE CHLORIDE 200 MG/10ML IV SOSY
PREFILLED_SYRINGE | INTRAVENOUS | Status: DC | PRN
  Administered 2018-01-11: 40 mg via INTRAVENOUS

## 2018-01-11 MED ORDER — ACETAMINOPHEN 650 MG RE SUPP: 650.0000 mg | RECTAL | Status: DC | PRN

## 2018-01-11 MED ORDER — VANCOMYCIN HCL IN DEXTROSE 1-5 GM/200ML-% IV SOLN
1000.0000 mg | Freq: Once | INTRAVENOUS | Status: AC
  Administered 2018-01-11: 1000 mg via INTRAVENOUS
  Filled 2018-01-11: qty 200

## 2018-01-11 MED ORDER — MIDAZOLAM HCL 2 MG/2ML IJ SOLN
INTRAMUSCULAR | Status: AC
  Filled 2018-01-11: qty 2

## 2018-01-11 MED ORDER — DEXAMETHASONE SODIUM PHOSPHATE 10 MG/ML IJ SOLN
INTRAMUSCULAR | Status: DC | PRN
  Administered 2018-01-11: 10 mg via INTRAVENOUS

## 2018-01-11 MED ORDER — PANTOPRAZOLE SODIUM 40 MG PO TBEC
40.0000 mg | DELAYED_RELEASE_TABLET | Freq: Every day | ORAL | Status: DC
  Administered 2018-01-11 – 2018-01-13 (×3): 40 mg via ORAL
  Filled 2018-01-11 (×4): qty 1

## 2018-01-11 MED ORDER — OXYCODONE-ACETAMINOPHEN 5-325 MG PO TABS
1.0000 | ORAL_TABLET | ORAL | Status: DC | PRN
  Administered 2018-01-11 – 2018-01-12 (×3): 1 via ORAL
  Filled 2018-01-11 (×2): qty 1

## 2018-01-11 MED ORDER — GABAPENTIN 400 MG PO CAPS
400.0000 mg | ORAL_CAPSULE | Freq: Two times a day (BID) | ORAL | Status: DC
  Administered 2018-01-11 – 2018-01-13 (×4): 400 mg via ORAL
  Filled 2018-01-11 (×4): qty 1

## 2018-01-11 MED ORDER — CHLORHEXIDINE GLUCONATE CLOTH 2 % EX PADS: 6.0000 | MEDICATED_PAD | Freq: Once | CUTANEOUS | Status: DC

## 2018-01-11 MED ORDER — LACTATED RINGERS IV SOLN
INTRAVENOUS | Status: DC
  Administered 2018-01-11: 11:00:00 via INTRAVENOUS

## 2018-01-11 MED ORDER — EPHEDRINE SULFATE-NACL 50-0.9 MG/10ML-% IV SOSY
PREFILLED_SYRINGE | INTRAVENOUS | Status: DC | PRN
  Administered 2018-01-11: 15 mg via INTRAVENOUS

## 2018-01-11 MED ORDER — ACETAMINOPHEN 325 MG PO TABS
650.0000 mg | ORAL_TABLET | ORAL | Status: DC | PRN
  Administered 2018-01-12: 650 mg via ORAL
  Filled 2018-01-11: qty 2

## 2018-01-11 MED ORDER — FENTANYL CITRATE (PF) 250 MCG/5ML IJ SOLN
INTRAMUSCULAR | Status: DC | PRN
  Administered 2018-01-11 (×2): 50 ug via INTRAVENOUS
  Administered 2018-01-11: 100 ug via INTRAVENOUS
  Administered 2018-01-11 (×3): 50 ug via INTRAVENOUS

## 2018-01-11 MED ORDER — SODIUM CHLORIDE 0.9% FLUSH
3.0000 mL | Freq: Two times a day (BID) | INTRAVENOUS | Status: DC
  Administered 2018-01-12 – 2018-01-13 (×3): 3 mL via INTRAVENOUS

## 2018-01-11 MED ORDER — VANCOMYCIN HCL IN DEXTROSE 1-5 GM/200ML-% IV SOLN
1000.0000 mg | INTRAVENOUS | Status: AC
  Administered 2018-01-11: 1000 mg via INTRAVENOUS

## 2018-01-11 MED ORDER — MORPHINE SULFATE (PF) 2 MG/ML IV SOLN
2.0000 mg | INTRAVENOUS | Status: DC | PRN
  Administered 2018-01-11 (×2): 2 mg via INTRAVENOUS
  Filled 2018-01-11 (×2): qty 1

## 2018-01-11 MED ORDER — THROMBIN 5000 UNITS EX SOLR
CUTANEOUS | Status: AC
  Filled 2018-01-11: qty 5000

## 2018-01-11 MED ORDER — MENTHOL 3 MG MT LOZG: 1.0000 | LOZENGE | OROMUCOSAL | Status: DC | PRN

## 2018-01-11 MED ORDER — OXYCODONE HCL 5 MG PO TABS
5.0000 mg | ORAL_TABLET | ORAL | Status: DC | PRN
  Administered 2018-01-11 – 2018-01-12 (×3): 5 mg via ORAL
  Filled 2018-01-11 (×3): qty 1

## 2018-01-11 MED ORDER — OXYCODONE-ACETAMINOPHEN 5-325 MG PO TABS
ORAL_TABLET | ORAL | Status: AC
  Filled 2018-01-11: qty 2

## 2018-01-11 MED ORDER — POLYETHYLENE GLYCOL 3350 17 G PO PACK
17.0000 g | PACK | Freq: Every day | ORAL | Status: DC | PRN
  Administered 2018-01-12: 17 g via ORAL
  Filled 2018-01-11: qty 1

## 2018-01-11 MED ORDER — HYDROXYZINE HCL 50 MG/ML IM SOLN
50.0000 mg | Freq: Four times a day (QID) | INTRAMUSCULAR | Status: DC | PRN
  Administered 2018-01-11: 50 mg via INTRAMUSCULAR
  Filled 2018-01-11: qty 1

## 2018-01-11 MED ORDER — AMITRIPTYLINE HCL 25 MG PO TABS
25.0000 mg | ORAL_TABLET | Freq: Every day | ORAL | Status: DC
  Administered 2018-01-11 – 2018-01-12 (×2): 25 mg via ORAL
  Filled 2018-01-11 (×2): qty 1

## 2018-01-11 MED ORDER — LIDOCAINE-EPINEPHRINE 1 %-1:100000 IJ SOLN
INTRAMUSCULAR | Status: AC
  Filled 2018-01-11: qty 1

## 2018-01-11 MED ORDER — FENTANYL CITRATE (PF) 100 MCG/2ML IJ SOLN
INTRAMUSCULAR | Status: AC
  Filled 2018-01-11: qty 2

## 2018-01-11 MED ORDER — FLEET ENEMA 7-19 GM/118ML RE ENEM: 1.0000 | ENEMA | Freq: Once | RECTAL | Status: DC | PRN

## 2018-01-11 MED ORDER — ONDANSETRON HCL 4 MG/2ML IJ SOLN: 4.0000 mg | Freq: Four times a day (QID) | INTRAMUSCULAR | Status: DC | PRN

## 2018-01-11 MED ORDER — ZOLPIDEM TARTRATE 5 MG PO TABS: 5.0000 mg | ORAL_TABLET | Freq: Every evening | ORAL | Status: DC | PRN

## 2018-01-11 MED ORDER — KCL IN DEXTROSE-NACL 20-5-0.45 MEQ/L-%-% IV SOLN
INTRAVENOUS | Status: DC
  Administered 2018-01-11: 23:00:00 via INTRAVENOUS
  Filled 2018-01-11: qty 1000

## 2018-01-11 MED ORDER — LIDOCAINE 2% (20 MG/ML) 5 ML SYRINGE
INTRAMUSCULAR | Status: DC | PRN
  Administered 2018-01-11: 80 mg via INTRAVENOUS

## 2018-01-11 MED ORDER — BISACODYL 10 MG RE SUPP
10.0000 mg | Freq: Every day | RECTAL | Status: DC | PRN
  Administered 2018-01-12: 10 mg via RECTAL
  Filled 2018-01-11: qty 1

## 2018-01-11 MED ORDER — VITAMIN D3 25 MCG (1000 UNIT) PO TABS
2000.0000 [IU] | ORAL_TABLET | Freq: Every day | ORAL | Status: DC
  Administered 2018-01-11 – 2018-01-13 (×3): 2000 [IU] via ORAL
  Filled 2018-01-11 (×5): qty 2

## 2018-01-11 MED ORDER — PHENOL 1.4 % MT LIQD: 1.0000 | OROMUCOSAL | Status: DC | PRN

## 2018-01-11 MED ORDER — PROPOFOL 10 MG/ML IV BOLUS
INTRAVENOUS | Status: DC | PRN
  Administered 2018-01-11: 200 mg via INTRAVENOUS

## 2018-01-11 MED ORDER — METHOCARBAMOL 1000 MG/10ML IJ SOLN
500.0000 mg | Freq: Four times a day (QID) | INTRAVENOUS | Status: DC | PRN
  Filled 2018-01-11: qty 5

## 2018-01-11 MED ORDER — SERTRALINE HCL 50 MG PO TABS
50.0000 mg | ORAL_TABLET | Freq: Every day | ORAL | Status: DC
  Administered 2018-01-12 – 2018-01-13 (×2): 50 mg via ORAL
  Filled 2018-01-11 (×2): qty 1

## 2018-01-11 MED ORDER — PRAVASTATIN SODIUM 40 MG PO TABS
40.0000 mg | ORAL_TABLET | Freq: Every day | ORAL | Status: DC
  Administered 2018-01-11 – 2018-01-13 (×3): 40 mg via ORAL
  Filled 2018-01-11 (×3): qty 1

## 2018-01-11 MED ORDER — SODIUM CHLORIDE 0.9 % IV SOLN
INTRAVENOUS | Status: DC | PRN
  Administered 2018-01-11: 20 ug/min via INTRAVENOUS

## 2018-01-11 MED ORDER — METHOCARBAMOL 500 MG PO TABS
500.0000 mg | ORAL_TABLET | Freq: Four times a day (QID) | ORAL | Status: DC | PRN
  Administered 2018-01-11 – 2018-01-12 (×3): 500 mg via ORAL
  Filled 2018-01-11 (×2): qty 1

## 2018-01-11 MED ORDER — DOCUSATE SODIUM 100 MG PO CAPS
100.0000 mg | ORAL_CAPSULE | Freq: Two times a day (BID) | ORAL | Status: DC
  Administered 2018-01-11 – 2018-01-13 (×5): 100 mg via ORAL
  Filled 2018-01-11 (×5): qty 1

## 2018-01-11 MED ORDER — VITAMIN B-12 1000 MCG PO TABS
1000.0000 ug | ORAL_TABLET | Freq: Every day | ORAL | Status: DC
  Administered 2018-01-11 – 2018-01-13 (×3): 1000 ug via ORAL
  Filled 2018-01-11 (×3): qty 1

## 2018-01-11 MED ORDER — DONEPEZIL HCL 5 MG PO TABS
5.0000 mg | ORAL_TABLET | Freq: Every day | ORAL | Status: DC
  Administered 2018-01-11 – 2018-01-12 (×2): 5 mg via ORAL
  Filled 2018-01-11 (×2): qty 1

## 2018-01-11 MED ORDER — GLYCOPYRROLATE PF 0.2 MG/ML IJ SOSY
PREFILLED_SYRINGE | INTRAMUSCULAR | Status: DC | PRN
  Administered 2018-01-11: .2 mg via INTRAVENOUS

## 2018-01-11 MED ORDER — PROPOFOL 10 MG/ML IV BOLUS
INTRAVENOUS | Status: AC
  Filled 2018-01-11: qty 20

## 2018-01-11 MED ORDER — ALUM & MAG HYDROXIDE-SIMETH 200-200-20 MG/5ML PO SUSP: 30.0000 mL | Freq: Four times a day (QID) | ORAL | Status: DC | PRN

## 2018-01-11 MED ORDER — BUPIVACAINE HCL (PF) 0.5 % IJ SOLN
INTRAMUSCULAR | Status: AC
  Filled 2018-01-11: qty 30

## 2018-01-11 MED ORDER — ONDANSETRON HCL 4 MG/2ML IJ SOLN
INTRAMUSCULAR | Status: DC | PRN
  Administered 2018-01-11: 4 mg via INTRAVENOUS

## 2018-01-11 MED ORDER — ONDANSETRON HCL 4 MG PO TABS: 4.0000 mg | ORAL_TABLET | Freq: Four times a day (QID) | ORAL | Status: DC | PRN

## 2018-01-11 MED ORDER — THROMBIN 5000 UNITS EX SOLR
OROMUCOSAL | Status: DC | PRN
  Administered 2018-01-11: 13:00:00 via TOPICAL

## 2018-01-11 MED ORDER — FENTANYL CITRATE (PF) 100 MCG/2ML IJ SOLN
25.0000 ug | INTRAMUSCULAR | Status: DC | PRN
  Administered 2018-01-11 (×3): 50 ug via INTRAVENOUS

## 2018-01-11 MED ORDER — LIDOCAINE-EPINEPHRINE 1 %-1:100000 IJ SOLN
INTRAMUSCULAR | Status: DC | PRN
  Administered 2018-01-11: 5 mL

## 2018-01-11 MED ORDER — 0.9 % SODIUM CHLORIDE (POUR BTL) OPTIME
TOPICAL | Status: DC | PRN
  Administered 2018-01-11: 1000 mL

## 2018-01-11 MED ORDER — METHOCARBAMOL 500 MG PO TABS
ORAL_TABLET | ORAL | Status: AC
  Filled 2018-01-11: qty 1

## 2018-01-11 MED ORDER — HYDROCODONE-ACETAMINOPHEN 5-325 MG PO TABS
2.0000 | ORAL_TABLET | ORAL | Status: DC | PRN
  Administered 2018-01-12 – 2018-01-13 (×3): 2 via ORAL
  Filled 2018-01-11 (×4): qty 2

## 2018-01-11 MED ORDER — VANCOMYCIN HCL IN DEXTROSE 1-5 GM/200ML-% IV SOLN
INTRAVENOUS | Status: AC
  Administered 2018-01-11: 1000 mg via INTRAVENOUS
  Filled 2018-01-11: qty 200

## 2018-01-11 MED ORDER — SODIUM CHLORIDE 0.9% FLUSH: 3.0000 mL | INTRAVENOUS | Status: DC | PRN

## 2018-01-11 MED ORDER — PHENYLEPHRINE 40 MCG/ML (10ML) SYRINGE FOR IV PUSH (FOR BLOOD PRESSURE SUPPORT)
PREFILLED_SYRINGE | INTRAVENOUS | Status: DC | PRN
  Administered 2018-01-11: 120 ug via INTRAVENOUS
  Administered 2018-01-11: 80 ug via INTRAVENOUS

## 2018-01-11 MED ORDER — FENTANYL CITRATE (PF) 250 MCG/5ML IJ SOLN
INTRAMUSCULAR | Status: AC
  Filled 2018-01-11: qty 5

## 2018-01-11 MED ORDER — BUPIVACAINE HCL (PF) 0.5 % IJ SOLN
INTRAMUSCULAR | Status: DC | PRN
  Administered 2018-01-11: 5 mL

## 2018-01-11 MED ORDER — DICYCLOMINE HCL 10 MG PO CAPS
10.0000 mg | ORAL_CAPSULE | Freq: Three times a day (TID) | ORAL | Status: DC | PRN
  Administered 2018-01-11: 10 mg via ORAL
  Filled 2018-01-11 (×2): qty 1

## 2018-01-11 MED ORDER — ONDANSETRON HCL 4 MG/2ML IJ SOLN: 4.0000 mg | Freq: Once | INTRAMUSCULAR | Status: DC | PRN

## 2018-01-11 SURGICAL SUPPLY — 55 items
BLADE CLIPPER SURG (BLADE) IMPLANT
BOLT SPNL LRG 45X5.5XPLAT NS (Screw) ×2 IMPLANT
CAGE MODULUS XL 10X18X45 - 10 (Cage) ×2 IMPLANT
CARTRIDGE OIL MAESTRO DRILL (MISCELLANEOUS) ×1 IMPLANT
COVER WAND RF STERILE (DRAPES) ×2 IMPLANT
DECANTER SPIKE VIAL GLASS SM (MISCELLANEOUS) ×2 IMPLANT
DERMABOND ADVANCED (GAUZE/BANDAGES/DRESSINGS) ×1
DERMABOND ADVANCED .7 DNX12 (GAUZE/BANDAGES/DRESSINGS) ×1 IMPLANT
DIFFUSER DRILL AIR PNEUMATIC (MISCELLANEOUS) ×2 IMPLANT
DRAPE C-ARM 42X72 X-RAY (DRAPES) ×2 IMPLANT
DRAPE C-ARMOR (DRAPES) ×2 IMPLANT
DRAPE LAPAROTOMY 100X72X124 (DRAPES) ×2 IMPLANT
DRSG OPSITE POSTOP 3X4 (GAUZE/BANDAGES/DRESSINGS) ×2 IMPLANT
DURAPREP 26ML APPLICATOR (WOUND CARE) ×2 IMPLANT
ELECT REM PT RETURN 9FT ADLT (ELECTROSURGICAL) ×2
ELECTRODE REM PT RTRN 9FT ADLT (ELECTROSURGICAL) ×1 IMPLANT
GAUZE 4X4 16PLY RFD (DISPOSABLE) IMPLANT
GLOVE BIO SURGEON STRL SZ8 (GLOVE) ×2 IMPLANT
GLOVE BIOGEL PI IND STRL 8 (GLOVE) ×1 IMPLANT
GLOVE BIOGEL PI IND STRL 8.5 (GLOVE) ×1 IMPLANT
GLOVE BIOGEL PI INDICATOR 8 (GLOVE) ×1
GLOVE BIOGEL PI INDICATOR 8.5 (GLOVE) ×1
GLOVE ECLIPSE 8.0 STRL XLNG CF (GLOVE) ×2 IMPLANT
GLOVE EXAM NITRILE LRG STRL (GLOVE) IMPLANT
GLOVE EXAM NITRILE XL STR (GLOVE) IMPLANT
GLOVE EXAM NITRILE XS STR PU (GLOVE) IMPLANT
GOWN STRL REUS W/ TWL LRG LVL3 (GOWN DISPOSABLE) IMPLANT
GOWN STRL REUS W/ TWL XL LVL3 (GOWN DISPOSABLE) ×2 IMPLANT
GOWN STRL REUS W/TWL 2XL LVL3 (GOWN DISPOSABLE) IMPLANT
GOWN STRL REUS W/TWL LRG LVL3 (GOWN DISPOSABLE)
GOWN STRL REUS W/TWL XL LVL3 (GOWN DISPOSABLE) ×2
KIT BASIN OR (CUSTOM PROCEDURE TRAY) ×2 IMPLANT
KIT DILATOR XLIF 5 (KITS) ×2 IMPLANT
KIT INFUSE XX SMALL 0.7CC (Orthopedic Implant) ×2 IMPLANT
KIT SURGICAL ACCESS MAXCESS 4 (KITS) ×2 IMPLANT
KIT TURNOVER KIT B (KITS) ×2 IMPLANT
MODULE NVM5 NEXT GEN EMG (NEEDLE) ×2 IMPLANT
NEEDLE HYPO 25X1 1.5 SAFETY (NEEDLE) ×2 IMPLANT
NS IRRIG 1000ML POUR BTL (IV SOLUTION) ×2 IMPLANT
OIL CARTRIDGE MAESTRO DRILL (MISCELLANEOUS) ×2
PACK LAMINECTOMY NEURO (CUSTOM PROCEDURE TRAY) ×2 IMPLANT
PLATE 2H 10MM (Plate) ×2 IMPLANT
PUTTY BONE ATTRAX 5CC STRIP (Putty) ×2 IMPLANT
SCREW 45MM (Screw) ×2 IMPLANT
SPONGE LAP 4X18 RFD (DISPOSABLE) IMPLANT
SPONGE SURGIFOAM ABS GEL SZ50 (HEMOSTASIS) IMPLANT
STAPLER SKIN PROX WIDE 3.9 (STAPLE) ×2 IMPLANT
SUT VIC AB 1 CT1 18XBRD ANBCTR (SUTURE) ×1 IMPLANT
SUT VIC AB 1 CT1 8-18 (SUTURE) ×1
SUT VIC AB 2-0 CT1 18 (SUTURE) ×2 IMPLANT
SUT VIC AB 3-0 SH 8-18 (SUTURE) ×2 IMPLANT
TOWEL GREEN STERILE (TOWEL DISPOSABLE) IMPLANT
TOWEL GREEN STERILE FF (TOWEL DISPOSABLE) IMPLANT
TRAY FOLEY MTR SLVR 16FR STAT (SET/KITS/TRAYS/PACK) ×2 IMPLANT
WATER STERILE IRR 1000ML POUR (IV SOLUTION) ×2 IMPLANT

## 2018-01-11 NOTE — Progress Notes (Addendum)
Pharmacy Antibiotic Note  Becky Salazar is a 72 y.o. female admitted on 01/11/2018 s/p Left Lumbar 2-3 Anterolateral lumbar interbody fusion with lateral plate (Left).  Pharmacy has been consulted for vancomycin dosing for surgical prophylaxis. No drains noted.   Plan: Vancomycin 1000mg  IV x 1 12 hours from preop dose.  Pharmacy will sign off.   Height: 5\' 3"  (160 cm) Weight: 178 lb 7.8 oz (81 kg) IBW/kg (Calculated) : 52.4  Temp (24hrs), Avg:97.8 F (36.6 C), Min:97.7 F (36.5 C), Max:97.8 F (36.6 C)  No results for input(s): WBC, CREATININE, LATICACIDVEN, VANCOTROUGH, VANCOPEAK, VANCORANDOM, GENTTROUGH, GENTPEAK, GENTRANDOM, TOBRATROUGH, TOBRAPEAK, TOBRARND, AMIKACINPEAK, AMIKACINTROU, AMIKACIN in the last 168 hours.  Estimated Creatinine Clearance: 46 mL/min (A) (by C-G formula based on SCr of 1.13 mg/dL (H)).    Allergies  Allergen Reactions  . Penicillin G Anaphylaxis    CHILDHOOD, SEVERE: "ALMOST DIED"  . Fesoterodine Rash    Toviaz - causes rash    Jeyson Deshotel A. Jeanella Craze, PharmD, BCPS Clinical Pharmacist  Pager: [phone] Please utilize Amion for appropriate phone number to reach the unit pharmacist [facility])    01/11/2018 3:46 PM

## 2018-01-11 NOTE — Progress Notes (Signed)
Patient ID: Becky Salazar, female   DOB: [birth year], 72 y.o.   MRN: [id number] Alert, conversant, smiling. Reports lumbar pain as expected. Ambulated w/c to bed with assistance, but required max encouragement. Good strength BLE. Will continue to mobilize as tolerated tonight and address PT needs in am.

## 2018-01-11 NOTE — Brief Op Note (Addendum)
01/11/2018  2:08 PM  PATIENT:  Becky Salazar  71 y.o. female  PRE-OPERATIVE DIAGNOSIS:  Lumbar stenosis with neurogenic claudication, scoliosis, foraminal stenosis, lumbago, radiculopathy  L 23 level  POST-OPERATIVE DIAGNOSIS:  Lumbar stenosis with neurogenic claudication, scoliosis, foraminal stenosis, lumbago, radiculopathy  L 23 level  PROCEDURE:  Procedure(s) with comments: Left Lumbar 2-3 Anterolateral lumbar interbody fusion with lateral plate (Left) - Left Lumbar 2-3 Anterolateral lumbar interbody fusion with lateral plate     SURGEON:  Surgeon(s) and Role:    * Dorraine Ellender, MD - Primary    * Cabbell, Kyle, MD - Assisting  PHYSICIAN ASSISTANT:   ASSISTANTS: Poteat, RN   ANESTHESIA:   general  EBL:  10 mL   BLOOD ADMINISTERED:none  DRAINS: none   LOCAL MEDICATIONS USED:  MARCAINE    and LIDOCAINE   SPECIMEN:  No Specimen  DISPOSITION OF SPECIMEN:  N/A  COUNTS:  YES  TOURNIQUET:  * No tourniquets in log *  DICTATION: Patient is a 71-year-old woman with severe spondylosis stenosis and scoliosis of the lumbar spine with spodylolisthesis. It was elected to take her to surgery for anterolateral decompression and lateral plate fixation at the L 23 level.    Procedure: Patient was brought to the operating room and placed in a left lateral decubitus position on the operative table and using orthogonally projected C-arm fluoroscopy the patient was placed so that the L  23  level was visualized in AP and lateral plane. The patient was then taped into position. The table was flexed so as to expose the L 23 level. Skin was marked along with a posterior finger dissection incision. His flank was then prepped and draped in usual sterile fashion and incisions were made overlying the 12 th rib. Posterior finger dissection was made to enter the retroperitoneal space and then subsequently the probe was inserted into the psoas muscle from the left side initially at the L 23  level. After mapping the neural elements were able to dock the probe per the midpoint of this vertebral level and without indications electrically of too close proximity to the neural tissues. Subsequently the self-retaining tractor was.after sequential dilators were utilized the shim was employed and the interspace was cleared of psoas muscle and then incised. A thorough discectomy was performed. Instruments were used to clear the interspace of disc material. After thorough discectomy was performed and this was performed using AP and lateral fluoroscopy a 10 lordotic by 45 x 18 mm implant was packed with BMP and Attrax. This was tamped into position and its position was confirmed on AP and lateral fluoroscopy. A Decade 2-hole lateral plate (size 12) was affixed to the lateral spine with 5.5 x 45 mm screws. All screws were torque locked and positioning was confirmed with AP and lateral fluoroscopy. . Hemostasis was assured the wounds were irrigated interrupted Vicryl sutures.Sterile occlusive dressing was placed with Dermabond. The patient was then extubated in the operating room and taken to recovery in stable and satisfactory condition having tolerated her operation well. Counts were correct at the end of the case.  Retractor time:  22:41 minutes  PLAN OF CARE: Admit to inpatient   PATIENT DISPOSITION:  PACU - hemodynamically stable.   Delay start of Pharmacological VTE agent (>24hrs) due to surgical blood loss or risk of bleeding: yes  

## 2018-01-11 NOTE — Op Note (Addendum)
01/11/2018  2:08 PM  PATIENT:  Becky Salazar  72 y.o. female  PRE-OPERATIVE DIAGNOSIS:  Lumbar stenosis with neurogenic claudication, scoliosis, foraminal stenosis, lumbago, radiculopathy  L 23 level  POST-OPERATIVE DIAGNOSIS:  Lumbar stenosis with neurogenic claudication, scoliosis, foraminal stenosis, lumbago, radiculopathy  L 23 level  PROCEDURE:  Procedure(s) with comments: Left Lumbar 2-3 Anterolateral lumbar interbody fusion with lateral plate (Left) - Left Lumbar 2-3 Anterolateral lumbar interbody fusion with lateral plate     SURGEON:  Surgeon(s) and Role:    Maeola Harman, MD - Primary    * Coletta Memos, MD - Assisting  PHYSICIAN ASSISTANT:   ASSISTANTS: Poteat, RN   ANESTHESIA:   general  EBL:  10 mL   BLOOD ADMINISTERED:none  DRAINS: none   LOCAL MEDICATIONS USED:  MARCAINE    and LIDOCAINE   SPECIMEN:  No Specimen  DISPOSITION OF SPECIMEN:  N/A  COUNTS:  YES  TOURNIQUET:  * No tourniquets in log *  DICTATION: Patient is a 72 year old woman with severe spondylosis stenosis and scoliosis of the lumbar spine with spodylolisthesis. It was elected to take her to surgery for anterolateral decompression and lateral plate fixation at the L 23 level.    Procedure: Patient was brought to the operating room and placed in a left lateral decubitus position on the operative table and using orthogonally projected C-arm fluoroscopy the patient was placed so that the L  23  level was visualized in AP and lateral plane. The patient was then taped into position. The table was flexed so as to expose the L 23 level. Skin was marked along with a posterior finger dissection incision. His flank was then prepped and draped in usual sterile fashion and incisions were made overlying the 12 th rib. Posterior finger dissection was made to enter the retroperitoneal space and then subsequently the probe was inserted into the psoas muscle from the left side initially at the L 23  level. After mapping the neural elements were able to dock the probe per the midpoint of this vertebral level and without indications electrically of too close proximity to the neural tissues. Subsequently the self-retaining tractor was.after sequential dilators were utilized the shim was employed and the interspace was cleared of psoas muscle and then incised. A thorough discectomy was performed. Instruments were used to clear the interspace of disc material. After thorough discectomy was performed and this was performed using AP and lateral fluoroscopy a 10 lordotic by 45 x 18 mm implant was packed with BMP and Attrax. This was tamped into position and its position was confirmed on AP and lateral fluoroscopy. A Decade 2-hole lateral plate (size 12) was affixed to the lateral spine with 5.5 x 45 mm screws. All screws were torque locked and positioning was confirmed with AP and lateral fluoroscopy. . Hemostasis was assured the wounds were irrigated interrupted Vicryl sutures.Sterile occlusive dressing was placed with Dermabond. The patient was then extubated in the operating room and taken to recovery in stable and satisfactory condition having tolerated her operation well. Counts were correct at the end of the case.  Retractor time:  22:41 minutes  PLAN OF CARE: Admit to inpatient   PATIENT DISPOSITION:  PACU - hemodynamically stable.   Delay start of Pharmacological VTE agent (>24hrs) due to surgical blood loss or risk of bleeding: yes

## 2018-01-11 NOTE — Anesthesia Postprocedure Evaluation (Signed)
Anesthesia Post Note  Patient: Becky Salazar  Procedure(s) Performed: Left Lumbar 2-3 Anterolateral lumbar interbody fusion with lateral plate (Left )     Patient location during evaluation: PACU Anesthesia Type: General Level of consciousness: awake and alert Pain management: pain level controlled Vital Signs Assessment: post-procedure vital signs reviewed and stable Respiratory status: spontaneous breathing, nonlabored ventilation, respiratory function stable and patient connected to nasal cannula oxygen Cardiovascular status: blood pressure returned to baseline and stable Postop Assessment: no apparent nausea or vomiting Anesthetic complications: no    Last Vitals:  Vitals:   01/11/18 1430 01/11/18 1554  BP: 128/87 131/77  Pulse: 91 69  Resp: 11 17  Temp:  36.4 C  SpO2: 93% 97%    Last Pain:  Vitals:   01/11/18 1554  TempSrc: Oral  PainSc:                  Ryan P Ellender

## 2018-01-11 NOTE — Transfer of Care (Signed)
Immediate Anesthesia Transfer of Care Note  Patient: Becky Salazar  Procedure(s) Performed: Left Lumbar 2-3 Anterolateral lumbar interbody fusion with lateral plate (Left )  Patient Location: PACU  Anesthesia Type:General  Level of Consciousness: awake, drowsy and patient cooperative  Airway & Oxygen Therapy: Patient Spontanous Breathing  Post-op Assessment: Report given to RN and Post -op Vital signs reviewed and stable  Post vital signs: Reviewed and stable  Last Vitals:  Vitals Value Taken Time  BP 130/75 01/11/2018  2:12 PM  Temp    Pulse 95 01/11/2018  2:19 PM  Resp 14 01/11/2018  2:19 PM  SpO2 95 % 01/11/2018  2:19 PM  Vitals shown include unvalidated device data.  Last Pain:  Vitals:   01/11/18 1050  TempSrc:   PainSc: 0-No pain         Complications: No apparent anesthesia complications

## 2018-01-11 NOTE — Interval H&P Note (Signed)
History and Physical Interval Note:  01/11/2018 11:44 AM  Becky Salazar  has presented today for surgery, with the diagnosis of Lumbar stenosis with neurogenic claudication  The various methods of treatment have been discussed with the patient and family. After consideration of risks, benefits and other options for treatment, the patient has consented to  Procedure(s) with comments: Left Lumbar 2-3 Anterolateral lumbar interbody fusion with lateral plate (Left) - Left Lumbar 2-3 Anterolateral lumbar interbody fusion with lateral plate as a surgical intervention .  The patient's history has been reviewed, patient examined, no change in status, stable for surgery.  I have reviewed the patient's chart and labs.  Questions were answered to the patient's satisfaction.     Dorian Heckle

## 2018-01-11 NOTE — Anesthesia Procedure Notes (Signed)
Procedure Name: Intubation Date/Time: 01/11/2018 12:29 PM Performed by: White, Amedeo Plenty, CRNA Pre-anesthesia Checklist: Patient identified, Emergency Drugs available, Suction available and Patient being monitored Patient Re-evaluated:Patient Re-evaluated prior to induction Oxygen Delivery Method: Circle System Utilized Preoxygenation: Pre-oxygenation with 100% oxygen Induction Type: IV induction Ventilation: Mask ventilation without difficulty Laryngoscope Size: Mac and 3 Grade View: Grade I Tube type: Oral Tube size: 7.0 mm Number of attempts: 1 Airway Equipment and Method: Stylet and Oral airway Placement Confirmation: ETT inserted through vocal cords under direct vision,  positive ETCO2 and breath sounds checked- equal and bilateral Secured at: 21 cm Tube secured with: Tape Dental Injury: Teeth and Oropharynx as per pre-operative assessment

## 2018-01-11 NOTE — Anesthesia Preprocedure Evaluation (Addendum)
Anesthesia Evaluation  Patient identified by MRN, date of birth, ID band Patient awake    Reviewed: Allergy & Precautions, NPO status , Patient's Chart, lab work & pertinent test results  Airway Mallampati: III  TM Distance: >3 FB Neck ROM: Full    Dental no notable dental hx.    Pulmonary former smoker,    Pulmonary exam normal breath sounds clear to auscultation       Cardiovascular + DVT  Normal cardiovascular exam Rhythm:Regular Rate:Normal  ECG: Normal sinus rhythm, rate 68 Possible Left atrial enlargement  Christoper Allegra, MD is PCP with [facility] in [place], last seen 12/28/2017.    Neuro/Psych PSYCHIATRIC DISORDERS Anxiety Depression negative neurological ROS     GI/Hepatic negative GI ROS, Neg liver ROS,   Endo/Other  negative endocrine ROS  Renal/GU negative Renal ROS     Musculoskeletal   Abdominal (+) + obese,   Peds  Hematology HLD   Anesthesia Other Findings Lumbar stenosis with neurogenic claudication  Reproductive/Obstetrics                           Anesthesia Physical Anesthesia Plan  ASA: II  Anesthesia Plan: General   Post-op Pain Management:    Induction: Intravenous  PONV Risk Score and Plan: 3 and Midazolam, Dexamethasone, Ondansetron and Treatment may vary due to age or medical condition  Airway Management Planned: Oral ETT  Additional Equipment:   Intra-op Plan:   Post-operative Plan: Extubation in OR  Informed Consent: I have reviewed the patients History and Physical, chart, labs and discussed the procedure including the risks, benefits and alternatives for the proposed anesthesia with the patient or authorized representative who has indicated his/her understanding and acceptance.   Dental advisory given  Plan Discussed with: CRNA  Anesthesia Plan Comments:         Anesthesia Quick Evaluation

## 2018-01-11 NOTE — Progress Notes (Signed)
Transferred  Patient to [facility] [address] 14 via w/c accompanied by pts daughter. Patient in no signs of distress at the time of transfer. Vital signs stable at the time of transfer. Report given to [facility].

## 2018-01-12 NOTE — Evaluation (Signed)
Physical Therapy Evaluation Patient Details Name: Becky Salazar MRN: [id number] DOB: 1945-05-31 Today's Date: 01/12/2018   History of Present Illness  Pt is a 72 y.o. F with significant PMH of LLE DVT, ankle surgeries who is now s/p left lumbar L2-3 anterolateral lumbar interbody fusion.  Clinical Impression  Patient is s/p above surgery resulting in the deficits listed below (see PT Problem List). Prior to admission, patient was independent and retired. Currently presenting with decreased functional mobility secondary to expected post operative pain, gait abnormalities, decreased strength and activity tolerance. Ambulating 60 feet with walker and min guard assist. Suspect patient will progress well. Will trial stair training prior to discharge home. Patient will benefit from skilled PT to increase their independence and safety with mobility (while adhering to their precautions) to allow discharge to the venue listed below.     Follow Up Recommendations No PT follow up;Supervision for mobility/OOB    Equipment Recommendations  None recommended by PT    Recommendations for Other Services       Precautions / Restrictions Precautions Precautions: Fall;Back Precaution Booklet Issued: Yes (comment) Precaution Comments: Verbally reviewed and provided written handout Required Braces or Orthoses: Spinal Brace Spinal Brace: Applied in sitting position Restrictions Weight Bearing Restrictions: No      Mobility  Bed Mobility Overal bed mobility: Needs Assistance Bed Mobility: Sit to Sidelying         Sit to sidelying: Min assist General bed mobility comments: min assist for LE back into bed  Transfers Overall transfer level: Needs assistance Equipment used: Rolling walker (2 wheeled) Transfers: Sit to/from Stand Sit to Stand: Min guard         General transfer comment: cues for hand placement  Ambulation/Gait Ambulation/Gait assistance: Min guard Gait Distance  (Feet): 60 Feet Assistive device: Rolling walker (2 wheeled) Gait Pattern/deviations: Step-through pattern;Step-to pattern;Decreased stride length;Decreased dorsiflexion - right;Decreased dorsiflexion - left Gait velocity: decr Gait velocity interpretation: <1.8 ft/sec, indicate of risk for recurrent falls General Gait Details: Slightly decreased left foot clearance with swing phase. Cues for walker proximity. Took several standing rest breaks  Stairs            Wheelchair Mobility    Modified Rankin (Stroke Patients Only)       Balance Overall balance assessment: Mild deficits observed, not formally tested                                           Pertinent Vitals/Pain Pain Assessment: Faces Faces Pain Scale: Hurts little more Pain Location: stomach, incisional site Pain Descriptors / Indicators: Cramping;Operative site guarding Pain Intervention(s): Limited activity within patient's tolerance;Monitored during session    Home Living Family/patient expects to be discharged to:: Private residence Living Arrangements: Spouse/significant other Available Help at Discharge: Family;Available 24 hours/day Type of Home: House Home Access: Level entry     Home Layout: Laundry or work area in basement;Two level Home Equipment: Shower seat      Prior Function Level of Independence: Independent               Hand Dominance   Dominant Hand: Right    Extremity/Trunk Assessment   Upper Extremity Assessment Upper Extremity Assessment: Defer to OT evaluation    Lower Extremity Assessment Lower Extremity Assessment: RLE deficits/detail;LLE deficits/detail RLE Deficits / Details: Grossly 5/5 except hip flexion 4/5 LLE Deficits / Details: MMT: hip flexion  3/5 (limited by pain), knee extension 4/5, ankle dorsiflexion 5/5    Cervical / Trunk Assessment Cervical / Trunk Assessment: Other exceptions Cervical / Trunk Exceptions: s/p spinal surgery   Communication   Communication: No difficulties  Cognition Arousal/Alertness: Awake/alert Behavior During Therapy: WFL for tasks assessed/performed Overall Cognitive Status: Within Functional Limits for tasks assessed                                        General Comments General comments (skin integrity, edema, etc.): Pt family present    Exercises     Assessment/Plan    PT Assessment Patient needs continued PT services  PT Problem List Decreased strength;Decreased activity tolerance;Decreased balance;Decreased mobility;Pain       PT Treatment Interventions DME instruction;Gait training;Stair training;Functional mobility training;Therapeutic activities;Therapeutic exercise;Balance training;Patient/family education    PT Goals (Current goals can be found in the Care Plan section)  Acute Rehab PT Goals Patient Stated Goal: Practice steps to get into basement PT Goal Formulation: With patient Time For Goal Achievement: 01/17/18 Potential to Achieve Goals: Good    Frequency Min 5X/week   Barriers to discharge        Co-evaluation               AM-PAC PT "6 Clicks" Daily Activity  Outcome Measure Difficulty turning over in bed (including adjusting bedclothes, sheets and blankets)?: A Little Difficulty moving from lying on back to sitting on the side of the bed? : Unable Difficulty sitting down on and standing up from a chair with arms (e.g., wheelchair, bedside commode, etc,.)?: A Little Help needed moving to and from a bed to chair (including a wheelchair)?: A Little Help needed walking in hospital room?: A Little Help needed climbing 3-5 steps with a railing? : A Little 6 Click Score: 16    End of Session Equipment Utilized During Treatment: Gait belt;Back brace Activity Tolerance: Patient tolerated treatment well Patient left: in bed;with call bell/phone within reach;with family/visitor present   PT Visit Diagnosis: Pain;Difficulty in  walking, not elsewhere classified (R26.2);Other abnormalities of gait and mobility (R26.89) Pain - part of body: (back, stomach)    Time: 4098-1191 PT Time Calculation (min) (ACUTE ONLY): 28 min   Charges:   PT Evaluation $PT Eval Low Complexity: 1 Low PT Treatments $Gait Training: 8-22 mins       Laurina Bustle, PT, DPT Acute Rehabilitation Services Pager [phone] Office [phone]   Vanetta Mulders 01/12/2018, 9:22 AM

## 2018-01-12 NOTE — Evaluation (Signed)
Occupational Therapy Evaluation Patient Details Name: Becky Salazar MRN: [id number] DOB: March 24, 1946 Today's Date: 01/12/2018    History of Present Illness Pt is a 72 y.o. F with significant PMH of LLE DVT, ankle surgeries who is now s/p left lumbar L2-3 anterolateral lumbar interbody fusion.   Clinical Impression   PTA, pt was living with her husband and was independent. Pt currently requiring Min A for UB ADLs, Mod A for LB ADLs, and Min Guard A for functional mobility. Providing pt with education on LB ADLs, toileting, bed mobility, brace management, and back precautions. Pt will need further acute OT to address LB ADLs and functional transfers. Recommend dc to home once medically stable per physician.     Follow Up Recommendations  No OT follow up;Supervision/Assistance - 24 hour    Equipment Recommendations  None recommended by OT    Recommendations for Other Services PT consult     Precautions / Restrictions Precautions Precautions: Fall;Back Precaution Booklet Issued: Yes (comment) Precaution Comments: Verbally reviewed and provided written handout Required Braces or Orthoses: Spinal Brace Spinal Brace: Applied in sitting position Restrictions Weight Bearing Restrictions: No      Mobility Bed Mobility Overal bed mobility: Needs Assistance Bed Mobility: Rolling;Sidelying to Sit Rolling: Min assist Sidelying to sit: Min guard       General bed mobility comments: Min A for rolling towards right  Transfers Overall transfer level: Needs assistance Equipment used: Rolling walker (2 wheeled) Transfers: Sit to/from Stand Sit to Stand: Min guard         General transfer comment: cues for hand placement    Balance Overall balance assessment: Mild deficits observed, not formally tested                                         ADL either performed or assessed with clinical judgement   ADL Overall ADL's : Needs  assistance/impaired Eating/Feeding: Independent;Sitting   Grooming: Wash/dry hands;Min guard;Standing   Upper Body Bathing: Minimal assistance;Sitting   Lower Body Bathing: Moderate assistance;Sit to/from stand   Upper Body Dressing : Minimal assistance;Sitting Upper Body Dressing Details (indicate cue type and reason): Min A for donning brace. Cues for sequencing.  Lower Body Dressing: Sit to/from stand;Moderate assistance Lower Body Dressing Details (indicate cue type and reason): Able to bring right ankle to knee for adjusting socks. Toilet Transfer: Min guard;Ambulation;RW;Regular Teacher, adult education Details (indicate cue type and reason): Min Guard A for safety. cues for hand placement Toileting- Clothing Manipulation and Hygiene: Min guard;Sit to/from stand Toileting - Clothing Manipulation Details (indicate cue type and reason): Educating pt on toielt hygiene after BM. Pt demonstrating understanding. Min Guard A for safety     Functional mobility during ADLs: Min guard;Rolling walker General ADL Comments: Pt motivated to participate in therapy. Providing education on toileting, LB ADLs, and functional transfers.      Vision         Perception     Praxis      Pertinent Vitals/Pain Pain Assessment: Faces Faces Pain Scale: Hurts little more Pain Location: stomach, incisional site Pain Descriptors / Indicators: Cramping;Operative site guarding Pain Intervention(s): Limited activity within patient's tolerance;Monitored during session;Repositioned     Hand Dominance Right   Extremity/Trunk Assessment Upper Extremity Assessment Upper Extremity Assessment: Overall WFL for tasks assessed   Lower Extremity Assessment Lower Extremity Assessment: Defer to PT evaluation RLE Deficits / Details:  Grossly 5/5 except hip flexion 4/5 LLE Deficits / Details: MMT: hip flexion 3/5 (limited by pain), knee extension 4/5, ankle dorsiflexion 5/5   Cervical / Trunk  Assessment Cervical / Trunk Assessment: Other exceptions Cervical / Trunk Exceptions: s/p spinal surgery   Communication Communication Communication: No difficulties   Cognition Arousal/Alertness: Awake/alert Behavior During Therapy: WFL for tasks assessed/performed Overall Cognitive Status: Within Functional Limits for tasks assessed                                     General Comments  Husband present throughout session    Exercises     Shoulder Instructions      Home Living Family/patient expects to be discharged to:: Private residence Living Arrangements: Spouse/significant other Available Help at Discharge: Family;Available 24 hours/day Type of Home: House Home Access: Level entry     Home Layout: Laundry or work area in basement;Two level Alternate Level Stairs-Number of Steps: Flight Alternate Level Stairs-Rails: Left Bathroom Shower/Tub: Walk-in shower(Walk in shower in basement and tub on main floor)   Bathroom Toilet: Standard     Home Equipment: Shower seat;Hand held shower head          Prior Functioning/Environment Level of Independence: Independent        Comments: Enjoys going ot the [employer] and participate in her church        OT Problem List: Decreased strength;Decreased range of motion;Decreased activity tolerance;Impaired balance (sitting and/or standing);Decreased knowledge of use of DME or AE;Decreased knowledge of precautions;Pain      OT Treatment/Interventions: Self-care/ADL training;Therapeutic exercise;Energy conservation;DME and/or AE instruction;Therapeutic activities;Patient/family education    OT Goals(Current goals can be found in the care plan section) Acute Rehab OT Goals Patient Stated Goal: [facility] OT Goal Formulation: With patient Time For Goal Achievement: 01/26/18 Potential to Achieve Goals: Good ADL Goals Pt Will Perform Upper Body Dressing: with modified independence;sitting Pt  Will Perform Lower Body Dressing: with set-up;with supervision;with caregiver independent in assisting;with adaptive equipment;sit to/from stand Pt Will Transfer to Toilet: with set-up;with supervision;ambulating;regular height toilet Pt Will Perform Toileting - Clothing Manipulation and hygiene: with set-up;with supervision;sit to/from stand Pt Will Perform Tub/Shower Transfer: Shower transfer;with set-up;with supervision;rolling walker;ambulating;shower seat  OT Frequency: Min 3X/week   Barriers to D/C:            Co-evaluation              AM-PAC PT "6 Clicks" Daily Activity     Outcome Measure Help from another person eating meals?: None Help from another person taking care of personal grooming?: A Little Help from another person toileting, which includes using toliet, bedpan, or urinal?: A Little Help from another person bathing (including washing, rinsing, drying)?: A Lot Help from another person to put on and taking off regular upper body clothing?: A Little Help from another person to put on and taking off regular lower body clothing?: A Lot 6 Click Score: 17   End of Session Equipment Utilized During Treatment: Back brace;Rolling walker Nurse Communication: Mobility status;Precautions;Weight bearing status  Activity Tolerance: Patient tolerated treatment well Patient left: in chair;with call bell/phone within reach;with family/visitor present  OT Visit Diagnosis: Unsteadiness on feet (R26.81);Other abnormalities of gait and mobility (R26.89);Muscle weakness (generalized) (M62.81);Pain Pain - part of body: (Back)                Time: 7829-5621 OT Time  Calculation (min): 28 min Charges:  OT General Charges $OT Visit: 1 Visit OT Evaluation $OT Eval Moderate Complexity: 1 Mod OT Treatments $Self Care/Home Management : 8-22 mins  Coralee Edberg MSOT, OTR/L Acute Rehab Pager: [phone] Office: [phone]  Theodoro Grist Treasure Ingrum 01/12/2018, 1:24 PM

## 2018-01-12 NOTE — Progress Notes (Signed)
Subjective: Patient reports less sore this morning  Objective: Vital signs in last 24 hours: Temp:  [97.6 F (36.4 C)-98.8 F (37.1 C)] 98 F (36.7 C) (10/12 0300) Pulse Rate:  [69-102] 97 (10/12 0300) Resp:  [11-18] 16 (10/11 1939) BP: (116-154)/(70-87) 116/70 (10/12 0300) SpO2:  [93 %-98 %] 96 % (10/12 0300) Weight:  [81 kg] 81 kg (10/11 1037)  Intake/Output from previous day: 10/11 0701 - 10/12 0700 In: 800 [I.V.:800] Out: 10 [Blood:10] Intake/Output this shift: No intake/output data recorded.  Physical Exam: Lower abdominal discomfort.  Strength full, dressings CDI.    Lab Results: No results for input(s): WBC, HGB, HCT, PLT in the last 72 hours. BMET No results for input(s): NA, K, CL, CO2, GLUCOSE, BUN, CREATININE, CALCIUM in the last 72 hours.  Studies/Results: Dg Lumbar Spine 2-3 Views  Result Date: 01/11/2018 CLINICAL DATA:  L2-3 fusion EXAM: LUMBAR SPINE - 2-3 VIEW; DG C-ARM 61-120 MIN COMPARISON:  None. FLUOROSCOPY TIME:  Radiation Exposure Index (as provided by the fluoroscopic device): 22.37 mGy If the device does not provide the exposure index: Fluoroscopy Time:  1 minutes 42 seconds Number of Acquired Images:  2 FINDINGS: Interbody fusion is noted at L2-3 with left lateral fixation. No other focal abnormality is noted. IMPRESSION: L2-3 fusion Electronically Signed   By: Alcide Clever M.D.   On: 01/11/2018 14:27   Dg C-arm 1-60 Min  Result Date: 01/11/2018 CLINICAL DATA:  L2-3 fusion EXAM: LUMBAR SPINE - 2-3 VIEW; DG C-ARM 61-120 MIN COMPARISON:  None. FLUOROSCOPY TIME:  Radiation Exposure Index (as provided by the fluoroscopic device): 22.37 mGy If the device does not provide the exposure index: Fluoroscopy Time:  1 minutes 42 seconds Number of Acquired Images:  2 FINDINGS: Interbody fusion is noted at L2-3 with left lateral fixation. No other focal abnormality is noted. IMPRESSION: L2-3 fusion Electronically Signed   By: Alcide Clever M.D.   On: 01/11/2018 14:27     Assessment/Plan: Patient is progressing well.  Mobilize today, anticipate discharge in AM if doing well.      LOS: 1 day    Dorian Heckle, MD 01/12/2018, 7:01 AM

## 2018-01-13 MED ORDER — METHOCARBAMOL 500 MG PO TABS: 500.0000 mg | ORAL_TABLET | Freq: Four times a day (QID) | ORAL | 1 refills | Status: AC | PRN

## 2018-01-13 MED ORDER — OXYCODONE HCL 5 MG PO TABS: 5.0000 mg | ORAL_TABLET | Freq: Four times a day (QID) | ORAL | 0 refills | Status: AC | PRN

## 2018-01-13 NOTE — Progress Notes (Signed)
Occupational Therapy Treatment Patient Details Name: Becky Salazar MRN: [id number] DOB: 10/25/1945 Today's Date: 01/13/2018    History of present illness Pt is a 72 y.o. F with significant PMH of LLE DVT, ankle surgeries who is now s/p left lumbar L2-3 anterolateral lumbar interbody fusion.   OT comments  Pt. Motivated for participation in skilled OT.  Husband present and active in session.  Pt. And spouse able to demonstrate safe donning of back brace, and tub transfer.  Spouse reports he will assist with LB ADLS prn so A/E not needed.    Follow Up Recommendations  No OT follow up;Supervision/Assistance - 24 hour    Equipment Recommendations  None recommended by OT    Recommendations for Other Services      Precautions / Restrictions Precautions Precautions: Fall;Back Required Braces or Orthoses: Spinal Brace Spinal Brace: Applied in sitting position       Mobility Bed Mobility               General bed mobility comments: seated in recliner at beginning and end of session  Transfers Overall transfer level: Needs assistance Equipment used: Rolling walker (2 wheeled) Transfers: Sit to/from [employer] Sit to Stand: Min guard         General transfer comment: cues for hand placement    Balance                                           ADL either performed or assessed with clinical judgement   ADL Overall ADL's : Needs assistance/impaired               Lower Body Bathing Details (indicate cue type and reason): declined need for A/E, husband present for session and confirmed he will assist with LB ADLs  Upper Body Dressing : Minimal assistance;Sitting Upper Body Dressing Details (indicate cue type and reason): Min A for donning brace. Cues for sequencing. husband assisted intermittently   Lower Body Dressing Details (indicate cue type and reason): declined need for A/E, husband present for session and  confirmed he will assist with LB ADLs          Tub/ Shower Transfer: Tub transfer;Min guard;Cueing for sequencing;With caregiver independent assisting Tub/Shower Transfer Details (indicate cue type and reason): pt. able to side step over tub with L faucet with husband assistance.  also stated she has a shower chair and had previously sat on it and guided b les into tub.  both options would work as long as she does not twist on the seated option.     General ADL Comments: Pt motivated to participate in therapy.     Vision       Perception     Praxis      Cognition Arousal/Alertness: Awake/alert Behavior During Therapy: WFL for tasks assessed/performed Overall Cognitive Status: Within Functional Limits for tasks assessed                                          Exercises     Shoulder Instructions       General Comments      Pertinent Vitals/ Pain       Pain Assessment: No/denies pain  Home Living  Prior Functioning/Environment              Frequency  Min 3X/week        Progress Toward Goals  OT Goals(current goals can now be found in the care plan section)  Progress towards OT goals: Progressing toward goals     Plan      Co-evaluation                 AM-PAC PT "6 Clicks" Daily Activity     Outcome Measure   Help from another person eating meals?: None Help from another person taking care of personal grooming?: A Little Help from another person toileting, which includes using toliet, bedpan, or urinal?: A Little Help from another person bathing (including washing, rinsing, drying)?: A Lot Help from another person to put on and taking off regular upper body clothing?: A Little Help from another person to put on and taking off regular lower body clothing?: A Lot 6 Click Score: 17    End of Session Equipment Utilized During Treatment: Rolling walker;Back brace  OT  Visit Diagnosis: Unsteadiness on feet (R26.81);Other abnormalities of gait and mobility (R26.89);Muscle weakness (generalized) (M62.81);Pain   Activity Tolerance Patient tolerated treatment well   Patient Left with call bell/phone within reach;with family/visitor present   Nurse Communication          Time: 1610-9604 OT Time Calculation (min): 24 min  Charges: OT General Charges $OT Visit: 1 Visit OT Treatments $Self Care/Home Management : 23-37 mins   Robet Leu, COTA/L 01/13/2018, 9:32 AM

## 2018-01-13 NOTE — Discharge Summary (Signed)
Physician Discharge Summary  Patient ID: Becky Salazar MRN: [id number] DOB/AGE: 72-12-1945 72 y.o.  Admit date: 01/11/2018 Discharge date: 01/13/2018   Admission Diagnoses:Lumbar stenosis with neurogenic claudication, scoliosis, foraminal stenosis, lumbago, radiculopathy L 23 level    Discharge Diagnoses: Lumbar stenosis with neurogenic claudication, scoliosis, foraminal stenosis, lumbago, radiculopathy L 23 level   Active Problems:   Lumbar scoliosis   Discharged Condition: good  Hospital Course: Becky Salazar was admitted and taken to the operating room for an uncomplicated lateral arthrodesis at L2/3 for stenosis and scoliosis. Post op she is ambulating,voiding, and tolerating a regular diet. Her wound is clean, dry, and without signs of infection.  Treatments: surgery: Left Lumbar 2-3 Anterolateral lumbar interbody fusion with lateral plate (Left) - Left Lumbar 2-3 Anterolateral lumbar interbody fusion with lateral plate      Discharge Exam: Blood pressure 137/80, pulse 81, temperature 98.6 F (37 C), resp. rate 16, height 5\' 3"  (1.6 m), weight 81 kg, SpO2 92 %. General appearance: alert, cooperative, appears stated age and no distress Neurologic: Alert and oriented X 3, normal strength and tone. Normal symmetric reflexes. Normal coordination and gait  Disposition: Discharge disposition: 01-Home or Self Care      Lumbar stenosis with neurogenic claudication  Allergies as of 01/13/2018      Reactions   Penicillin G Anaphylaxis   CHILDHOOD, SEVERE: "ALMOST DIED"   Fesoterodine Rash   Toviaz - causes rash       Medication List    STOP taking these medications   oxyCODONE-acetaminophen 5-325 MG tablet Commonly known as:  PERCOCET/ROXICET     TAKE these medications   amitriptyline 25 MG tablet Commonly known as:  ELAVIL Take 25 mg by mouth at bedtime.   donepezil 5 MG tablet Commonly known as:  ARICEPT Take 5 mg by mouth at bedtime.   ELIQUIS 5  MG Tabs tablet Generic drug:  apixaban Take 5 mg by mouth 2 (two) times daily.   gabapentin 400 MG capsule Commonly known as:  NEURONTIN Take 400 mg by mouth 2 (two) times daily.   methocarbamol 500 MG tablet Commonly known as:  ROBAXIN Take 1 tablet (500 mg total) by mouth every 6 (six) hours as needed for muscle spasms.   oxyCODONE 5 MG immediate release tablet Commonly known as:  Oxy IR/ROXICODONE Take 1 tablet (5 mg total) by mouth every 6 (six) hours as needed for severe pain.   pravastatin 40 MG tablet Commonly known as:  PRAVACHOL Take 40 mg by mouth daily.   sertraline 50 MG tablet Commonly known as:  ZOLOFT Take 50 mg by mouth daily.   vitamin B-12 1000 MCG tablet Commonly known as:  CYANOCOBALAMIN Take 1,000 mcg by mouth daily.   Vitamin D 2000 units Caps Take 1 capsule by mouth daily.      Follow-up Information    Maeola Harman, MD Follow up in 3 week(s).   Specialty:  Neurosurgery Why:  please call the office to make an appointment Contact information: [address] [place] [address] [phone]           Signed: Carmela Hurt 01/13/2018, 1:32 PM

## 2018-01-13 NOTE — Progress Notes (Signed)
IV removed, discharge instructions reviewed with patient.  Patient being discharged home with husband and transported to car via wheelchair.

## 2018-01-13 NOTE — Discharge Instructions (Signed)
Information on my medicine - ELIQUIS (apixaban)  This medication education was reviewed with me or my healthcare representative as part of my discharge preparation.  The pharmacist that spoke with me during my hospital stay was:  Barton Fanny Nimer, RPH  Why was Eliquis prescribed for you? Eliquis was prescribed to treat blood clots that may have been found in the veins of your legs (deep vein thrombosis) or in your lungs (pulmonary embolism) and to reduce the risk of them occurring again.  What do You need to know about Eliquis ? Continue taking ONE 5 mg tablet taken TWICE daily.  Eliquis may be taken with or without food.   Try to take the dose about the same time in the morning and in the evening. If you have difficulty swallowing the tablet whole please discuss with your pharmacist how to take the medication safely.  Take Eliquis exactly as prescribed and DO NOT stop taking Eliquis without talking to the doctor who prescribed the medication.  Stopping may increase your risk of developing a new blood clot.  Refill your prescription before you run out.  After discharge, you should have regular check-up appointments with your healthcare provider that is prescribing your Eliquis.    What do you do if you miss a dose? If a dose of ELIQUIS is not taken at the scheduled time, take it as soon as possible on the same day and twice-daily administration should be resumed. The dose should not be doubled to make up for a missed dose.  Important Safety Information A possible side effect of Eliquis is bleeding. You should call your healthcare provider right away if you experience any of the following: ? Bleeding from an injury or your nose that does not stop. ? Unusual colored urine (red or dark brown) or unusual colored stools (red or black). ? Unusual bruising for unknown reasons. ? A serious fall or if you hit your head (even if there is no bleeding).  Some medicines may interact with Eliquis  and might increase your risk of bleeding or clotting while on Eliquis. To help avoid this, consult your healthcare provider or pharmacist prior to using any new prescription or non-prescription medications, including herbals, vitamins, non-steroidal anti-inflammatory drugs (NSAIDs) and supplements.  This website has more information on Eliquis (apixaban): http://www.eliquis.com/eliquis/home

## 2018-01-13 NOTE — Progress Notes (Signed)
Physical Therapy Treatment Patient Details Name: Becky Salazar MRN: [id number] DOB: Nov 22, 1945 Today's Date: 01/13/2018    History of Present Illness Pt is a 72 y.o. F with significant PMH of LLE DVT, ankle surgeries who is now s/p left lumbar L2-3 anterolateral lumbar interbody fusion.    PT Comments    Patient is progressing very well towards their physical therapy goals. Demonstrates good pain control and improved activity tolerance. Ambulating 100 feet with walker and supervision, displaying slightly slowed cadence but no evidence of imbalance. Negotiated 6 steps with left railing and min assist to simulate home set up; recommended pt husband or family member always guard patient when attempting stair climbing. Adequate for discharge home today. Will be an excellent candidate for OPPT to maximize functional independence once appropriate from surgeon's standpoint.    Follow Up Recommendations  No PT follow up;Supervision for mobility/OOB     Equipment Recommendations  None recommended by PT    Recommendations for Other Services       Precautions / Restrictions Precautions Precautions: Fall;Back Precaution Booklet Issued: Yes (comment) Precaution Comments: Verbally reviewed and provided written handout Required Braces or Orthoses: Spinal Brace Spinal Brace: Applied in sitting position Restrictions Weight Bearing Restrictions: No    Mobility  Bed Mobility               General bed mobility comments: Received standing at sink  Transfers Overall transfer level: Needs assistance Equipment used: Rolling walker (2 wheeled) Transfers: Sit to/from Stand Sit to Stand: Supervision         General transfer comment: good technique  Ambulation/Gait Ambulation/Gait assistance: Supervision Gait Distance (Feet): 100 Feet Assistive device: Rolling walker (2 wheeled) Gait Pattern/deviations: Step-through pattern;Step-to pattern;Decreased stride length;Decreased  dorsiflexion - right;Decreased dorsiflexion - left Gait velocity: decr   General Gait Details: Cues for walker proximity and increased gait speed. Took 2 short standing rest breaks   Stairs Stairs: Yes Stairs assistance: Min assist Stair Management: One rail Left Number of Stairs: 6 General stair comments: Patient requiring minA to negotiate steps with step by step pattern and cues for sequencing. One mild posterior LOB.    Wheelchair Mobility    Modified Rankin (Stroke Patients Only)       Balance Overall balance assessment: Mild deficits observed, not formally tested                                          Cognition Arousal/Alertness: Awake/alert Behavior During Therapy: WFL for tasks assessed/performed Overall Cognitive Status: Within Functional Limits for tasks assessed                                        Exercises      General Comments        Pertinent Vitals/Pain Pain Assessment: Faces Faces Pain Scale: Hurts little more Pain Location: incisional site Pain Descriptors / Indicators: Operative site guarding;Sore Pain Intervention(s): Monitored during session    Home Living                      Prior Function            PT Goals (current goals can now be found in the care plan section) Acute Rehab PT Goals Patient Stated Goal: Practice steps to get into  basement PT Goal Formulation: With patient Time For Goal Achievement: 01/17/18 Potential to Achieve Goals: Good Progress towards PT goals: Progressing toward goals    Frequency    Min 5X/week      PT Plan Current plan remains appropriate    Co-evaluation              AM-PAC PT "6 Clicks" Daily Activity  Outcome Measure  Difficulty turning over in bed (including adjusting bedclothes, sheets and blankets)?: A Little Difficulty moving from lying on back to sitting on the side of the bed? : Unable Difficulty sitting down on and standing up  from a chair with arms (e.g., wheelchair, bedside commode, etc,.)?: A Little Help needed moving to and from a bed to chair (including a wheelchair)?: A Little Help needed walking in hospital room?: A Little Help needed climbing 3-5 steps with a railing? : A Little 6 Click Score: 16    End of Session Equipment Utilized During Treatment: Gait belt;Back brace Activity Tolerance: Patient tolerated treatment well Patient left: with call bell/phone within reach;with family/visitor present;in chair   PT Visit Diagnosis: Pain;Difficulty in walking, not elsewhere classified (R26.2);Other abnormalities of gait and mobility (R26.89) Pain - part of body: (back, stomach)     Time: 9604-5409 PT Time Calculation (min) (ACUTE ONLY): 17 min  Charges:  $Gait Training: 8-22 mins                     Laurina Bustle, PT, DPT Acute Rehabilitation Services Pager [phone] Office [phone]    Vanetta Mulders 01/13/2018, 9:55 AM

## 2018-01-14 ENCOUNTER — Encounter (HOSPITAL_COMMUNITY): Payer: Self-pay | Admitting: Neurosurgery

## 2018-01-17 MED FILL — Heparin Sodium (Porcine) Inj 1000 Unit/ML: INTRAMUSCULAR | Qty: 30 | Status: AC

## 2018-01-17 MED FILL — Sodium Chloride IV Soln 0.9%: INTRAVENOUS | Qty: 1000 | Status: AC

## 2019-11-06 IMAGING — RF DG C-ARM 61-120 MIN
1 series · 2 of 2 positions shown · non-contrast
Comparison: None.

CLINICAL DATA: L2-3 fusion

EXAM:
LUMBAR SPINE - 2-3 VIEW; DG C-ARM 61-120 MIN

[Series 1: run · 2 of 2 slices shown]
[im 1/2]
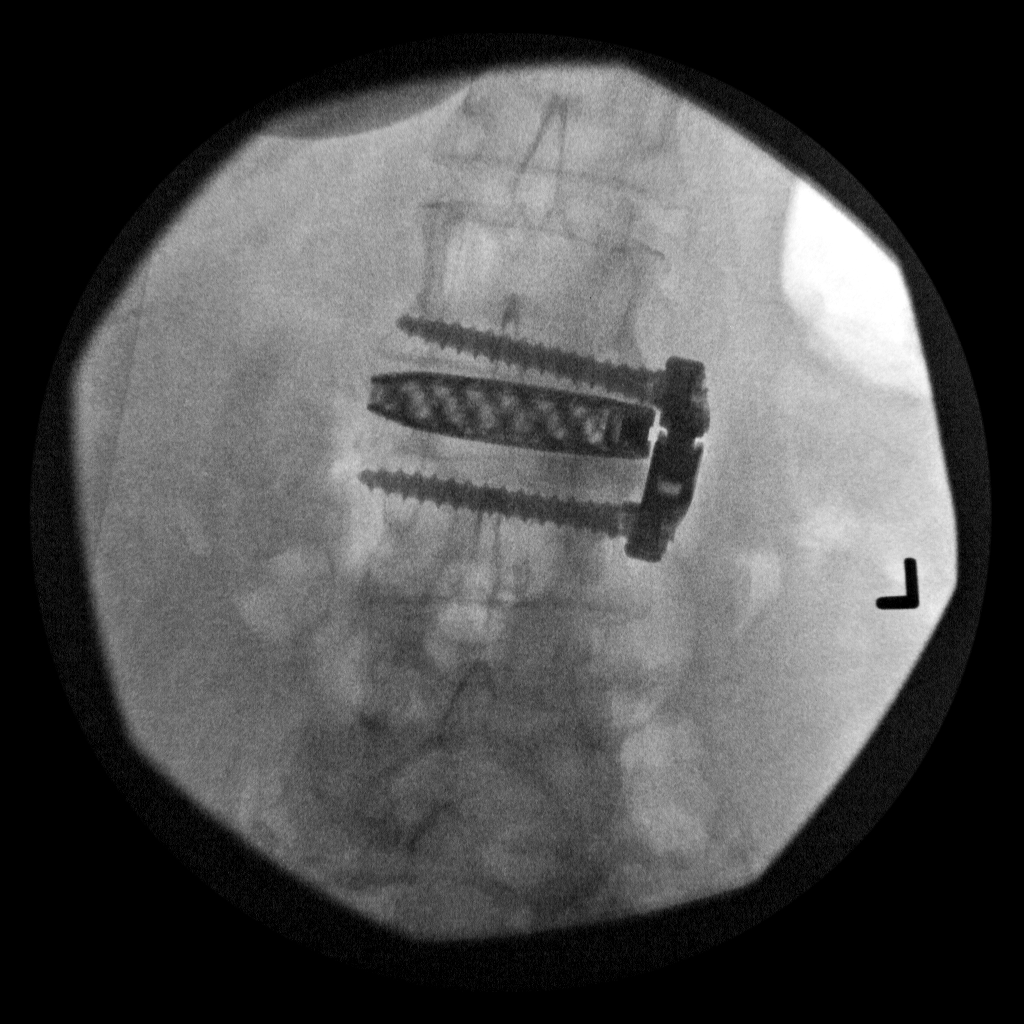
[im 2/2]
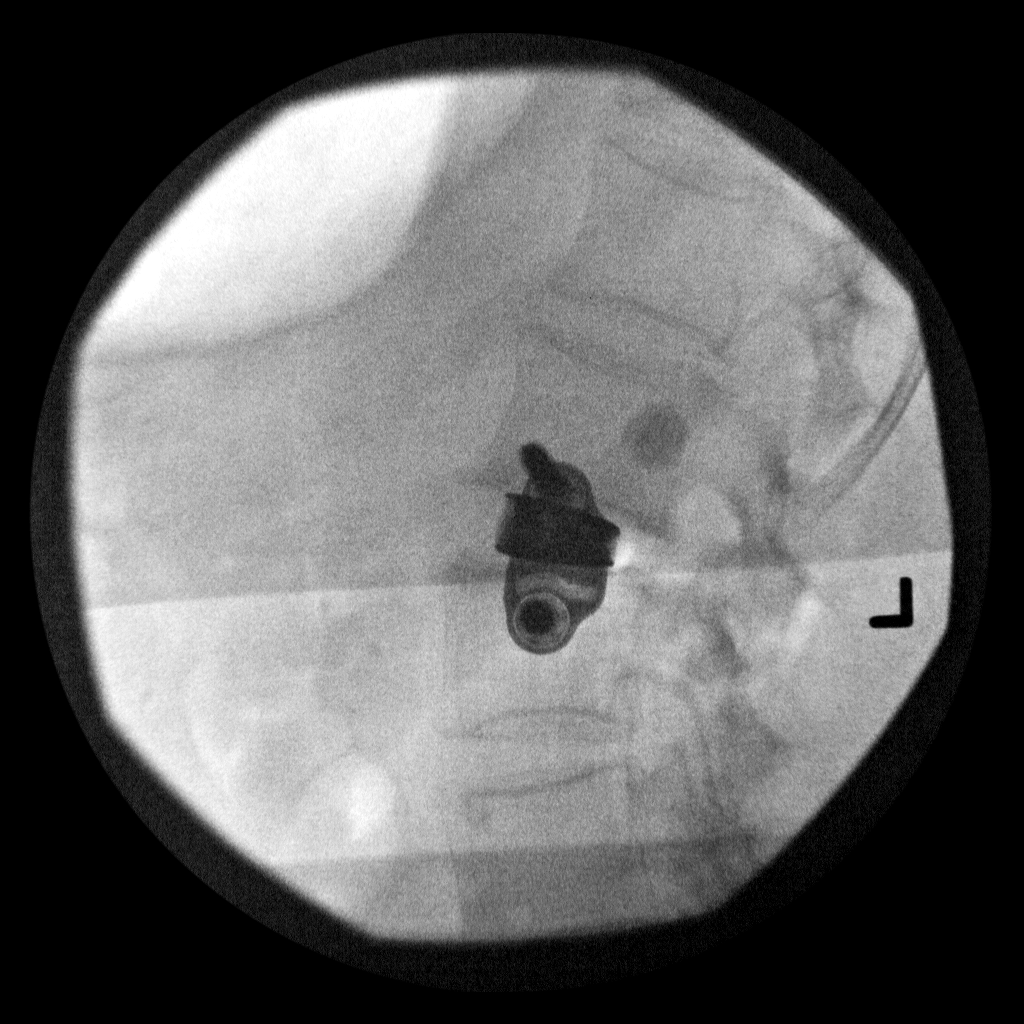

[2 of 2 positions shown; findings below may reference images not displayed]

FLUOROSCOPY TIME:  Radiation Exposure Index (as provided by the
fluoroscopic device): 22.37 mGy

If the device does not provide the exposure index:

Fluoroscopy Time:  1 minutes 42 seconds

Number of Acquired Images:  2
FINDINGS: Interbody fusion is noted at L2-3 with left lateral fixation. No
other focal abnormality is noted.
IMPRESSION: L2-3 fusion

## 2021-10-05 HISTORY — PX: HERNIA REPAIR: SHX51

## 2021-11-01 DIAGNOSIS — R0609 Other forms of dyspnea: Secondary | ICD-10-CM

## 2021-11-01 HISTORY — DX: Other forms of dyspnea: R06.09

## 2021-12-13 ENCOUNTER — Encounter: Payer: Self-pay | Admitting: Surgery

## 2021-12-13 ENCOUNTER — Ambulatory Visit: Payer: Medicare PPO | Admitting: Surgery

## 2021-12-13 VITALS — BP 138/81 | HR 81 | Temp 98.0°F | Ht 62.0 in | Wt 174.0 lb

## 2021-12-13 DIAGNOSIS — Z7901 Long term (current) use of anticoagulants: Secondary | ICD-10-CM

## 2021-12-13 DIAGNOSIS — Z6831 Body mass index (BMI) 31.0-31.9, adult: Secondary | ICD-10-CM

## 2021-12-13 DIAGNOSIS — K469 Unspecified abdominal hernia without obstruction or gangrene: Secondary | ICD-10-CM

## 2021-12-13 DIAGNOSIS — Z86718 Personal history of other venous thrombosis and embolism: Secondary | ICD-10-CM

## 2021-12-13 NOTE — Progress Notes (Signed)
Consultation Report      Visit Date Time: 12/13/2021 3:19 PM     Referring Provider:  Dr. Fermin Schwab  [facility] Provider: Lenice Llamas, MD, FACS    Service: General Surgery    Reason For Visit:  Hernia evaluation       History of Present Illness:     Tanya Walters is a 76 y.o. female who  has a past medical history of Arthritis, Chronic obstructive pulmonary disease, Depression, Gastroesophageal reflux disease, and Seasonal allergic rhinitis..  She presents on referral for initial consultation of flank hernia    She is status post open repair of left flank hernia on October 05, 2021 in [place] complicated by immediate recurrence and pain.  Recent CT imaging demonstrates a roughly 9 cm left posterior wall defect containing a bowel    She reports some constipation, in addition to left inguinal pain.  CT imaging also demonstrates questionable left inguinal hernia    Her medical history is significant for DVT currently on Eliquis    The following data was personally reviewed during the evaluation of this problem:  previous medical records/operative notes, lab reports, imaging reports, and images or tracings      Abdominal Surgical History:  Past Surgical History:   Procedure Laterality Date    APPENDECTOMY (OPEN)  as a child    HERNIA REPAIR  October 05 2021    HYSTERECTOMY  mid 1990's    SPINE SURGERY  2019    fusion         Modifiable Comorbid Conditions:  - BMI: Body mass index is 31.83 kg/m. ,  Acceptable  - Hx Diabetes: no   - History of smoking: No    - History of chronic steroids: No    - History of radiation treatment: No      Current mesh configuration: Unknown    Imaging review: 9 centimeter flank hernia defect      Past Medical History:     Past Medical History:   Diagnosis Date    Arthritis     Chronic obstructive pulmonary disease     bronchitis    Depression     Gastroesophageal reflux disease     Seasonal allergic rhinitis     Penicillin, Tovaz,       Past Surgical History:     Past Surgical  History:   Procedure Laterality Date    APPENDECTOMY (OPEN)  as a child    HERNIA REPAIR  October 05 2021    HYSTERECTOMY  mid 1990's    SPINE SURGERY  2019    fusion       Family History:     Family History   Problem Relation Age of Onset    Heart disease Father         died at age 96    Diabetes Sister     Diabetes Brother     Heart disease Brother        Social History:     Social History     Socioeconomic History    Marital status: Married   Tobacco Use    Smoking status: Former     Packs/day: .5     Types: Cigarettes     Quit date: 04/03/2004     Years since quitting: 17.7   Substance and Sexual Activity    Alcohol use: Never    Drug use: Never    Sexual activity: Never     Social Determinants of Health  Financial Resource Strain: Low Risk  (12/12/2021)    Overall Financial Resource Strain (CARDIA)     Difficulty of Paying Living Expenses: Not hard at all   Food Insecurity: No Food Insecurity (12/12/2021)    Hunger Vital Sign     Worried About Running Out of Food in the Last Year: Never true     Ran Out of Food in the Last Year: Never true   Transportation Needs: No Transportation Needs (12/12/2021)    PRAPARE - Therapist, art (Medical): No     Lack of Transportation (Non-Medical): No   Physical Activity: Inactive (12/12/2021)    Exercise Vital Sign     Days of Exercise per Week: 0 days     Minutes of Exercise per Session: 0 min   Social Connections: Socially Integrated (12/12/2021)    Social Connection and Isolation Panel [NHANES]     Frequency of Communication with Friends and Family: Twice a week     Frequency of Social Gatherings with Friends and Family: Once a week     Attends Religious Services: More than 4 times per year     Active Member of [employer] or Organizations: Yes     Attends Engineer, structural: More than 4 times per year     Marital Status: Married   Catering manager Violence: Not At Risk (12/12/2021)    Humiliation, Afraid, Rape, and Kick questionnaire     Fear of  Current or Ex-Partner: No     Emotionally Abused: No     Physically Abused: No     Sexually Abused: No   Housing Stability: Low Risk  (12/12/2021)    Housing Stability Vital Sign     Unable to Pay for Housing in the Last Year: No     Number of Places Lived in the Last Year: 1     Unstable Housing in the Last Year: No       Allergies:   Hydrocortisone, Penicillins, and Toviaz [fesoterodine]    Medications:     Current Outpatient Medications   Medication Sig Dispense Refill    apixaban (ELIQUIS) 5 MG Take 1 tablet (5 mg) by mouth 2 (two) times daily      CALCIUM PO Take by mouth      Cholecalciferol (Vitamin D) 50 MCG (2000 UT) Cap Take 1 capsule by mouth      Cyanocobalamin (VITAMIN B-12 PO) Take by mouth      diphenhydrAMINE-acetaminophen (TYLENOL PM) 25-500 MG Tab Take 1 tablet by mouth      Myrbetriq 25 MG Tablet SR 24 hr       pantoprazole (PROTONIX) 40 MG tablet       sertraline (ZOLOFT) 100 MG tablet Take 1 tablet (100 mg) by mouth daily       No current facility-administered medications for this visit.       Review of Systems:   Review of Systems   Constitutional:  Positive for malaise/fatigue. Negative for chills, diaphoresis, fever and weight loss.   HENT:  Negative for congestion, ear discharge, ear pain, hearing loss, nosebleeds, sinus pain, sore throat and tinnitus.    Eyes:  Negative for blurred vision, double vision, photophobia, pain, discharge and redness.   Respiratory:  Negative for cough, hemoptysis, sputum production, shortness of breath, wheezing and stridor.    Cardiovascular:  Negative for chest pain, palpitations, orthopnea, claudication, leg swelling and PND.   Gastrointestinal:  Negative for abdominal pain, blood in stool,  constipation, diarrhea, heartburn, melena, nausea and vomiting.   Genitourinary:  Negative for dysuria, flank pain, frequency, hematuria and urgency.   Musculoskeletal:  Negative for back pain, falls, joint pain, myalgias and neck pain.   Skin:  Negative for itching and  rash.   Neurological:  Negative for dizziness, tingling, tremors, sensory change, speech change, focal weakness, seizures, loss of consciousness, weakness and headaches.   Endo/Heme/Allergies:  Negative for environmental allergies and polydipsia. Does not bruise/bleed easily.   Psychiatric/Behavioral:  Positive for depression and memory loss. Negative for hallucinations, substance abuse and suicidal ideas. The patient is not nervous/anxious and does not have insomnia.       As per the HPI and above. The patient otherwise denies any additional changes to their otic, opthalmologic, dermatologic, pulmonary, cardiac, gastrointestinal, genitourinary, musculoskeletal, hematologic, constitutional, or psychiatric systems.      Physical Exam:     Vitals:    12/13/21 1428   BP: 138/81   Pulse: 81   Temp: 98 F (36.7 C)   SpO2: 95%       Focused physical exam:    Constitutional: Appears well, stated age.  Well nourished and well developed.  Abdomen: obese abdomen, nontender and nondistended, with no masses, or organomegaly  Hernia exam: Incarcerated recurrent left flank hernia  Skin: Normal color and turgor with no rash, lesions or ulceration.  No induration or nodules on palpation.      Labs:   No results found for: "CBC", "BMP"   Rads:     Radiology Results (24 Hour)       ** No results found for the last 24 hours. **          No results found.   Impression:   There is no problem list on file for this patient.    1. BMI 31.0-31.9,adult    2. History of DVT (deep vein thrombosis)    3. Anticoagulated    4. Recurrent hernia     Plan:     I did extensive discussion with the patient and her family about the etiology of abdominal wall hernias    She has developed a recurrent flank hernia on her left roughly 9 cm, containing bowel    We will proceed with open repair of recurrent hernia with anticipated hospital stay of 1 to 2 days    She is currently on Eliquis indicated for DVT and this will need to be held 48 hours prior to  surgery    All questions answered      Elevated surgical risk due to underlying cormorbidities:   Past Medical History:   Diagnosis Date    Arthritis     Chronic obstructive pulmonary disease     bronchitis    Depression     Gastroesophageal reflux disease     Seasonal allergic rhinitis     Penicillin, Tovaz,       Medical data and information of moderate complexity reviewed in detail including decision for surgery    Thank you for sending this patient to [facility] for evaluation.  We appreciate the opportunity to participate in their care.  We will address the issue for which they were sent, and afterward return the patient to your care and/or their PCP for any ongoing issues.  If you have any questions, do not hesitate to contact us at [phone].    Blood Pressure Screening - Preventive Care and Screening for High BP and Follow-Up    QM MIPS HTN -  Specialists: Pt was informed that the blood pressure measured at today's office visit was elevated above goal.   Advised patient to Follow-up with PCP for further monitoring and management of high blood pressure.        Signed by: Lenice Llamas, MD, FACS

## 2021-12-14 ENCOUNTER — Other Ambulatory Visit: Payer: Self-pay | Admitting: Surgery

## 2021-12-14 ENCOUNTER — Telehealth: Payer: Medicare PPO | Admitting: Surgery

## 2021-12-14 ENCOUNTER — Encounter: Payer: Self-pay | Admitting: Surgery

## 2021-12-14 DIAGNOSIS — K432 Incisional hernia without obstruction or gangrene: Secondary | ICD-10-CM | POA: Insufficient documentation

## 2021-12-14 NOTE — Progress Notes (Signed)
Incisional hernia repair 3 cm to 10 cm incarcerated/strangulated, Open    CPT code 32023  ICD 10 K43.2    Posting sent to [facility] for 01/09/22 at 1400    Prior auth to be obtained by [facility] billing    Surgery Scheduling details discussed with pateint's spouse

## 2021-12-27 ENCOUNTER — Ambulatory Visit: Payer: Medicare PPO

## 2021-12-27 ENCOUNTER — Telehealth: Payer: Self-pay

## 2021-12-27 NOTE — Telephone Encounter (Signed)
Contacted pt's daughter on HIPPA re: rescheduling of surgery as per family request. LM with direct call back number stated. Surgery scheduler updated.

## 2021-12-27 NOTE — Telephone Encounter (Signed)
Patient's daughter in-law asked for a call from nursing before re scheduling surgery.  Message sent to [facility]

## 2021-12-27 NOTE — Progress Notes (Signed)
Surgeon Testing Requirements:  N/a    Anesthesia Guideline Requirements:  N/a    Documentation already in chart:  N/a    Faxes Sent / Other Specialist Notes, Results, Records Requested:  PMD - spoke with Jamesetta So and she verbalized ewill fax PMD LOV/labs/ekg to [phone] asap      Future Plan / Upcoming Appts / Labs or Other Testing at [facility]:   N/a    Epic Orders:   Day of Surgery (DOS) order set entered  Pharmacy   []  N/A, None on posting   []  E-faxed to pharmacy [phone])  Other   []  SCD's  []  Urine HCG      E-mail Sent To Patient: Patient verbally consented to have post interview email sent to email address below:  [email or web address]   Emailed information included:   [x]  PSS Points of Contact: [email or web address] or phone [phone] to patient/family member          [x]  NPO Instructions per Preoperative Fasting Guidelines for Elective Surgeries and Procedures Requiring Anesthesia Policy   [x]  Reviewed Visitor policy   []  Preparing for surgery brochure or other specific surgery brochure (Joints, Shoulder, Spine)         [x]  Pre Procedural Skin Prep CHG Instructions   []  Bariatric Education Bundle Information   []  Patient requests no e-mail and will access AVS, PSS education/brochure/hibiclens instructions via link in my chart      Important Patient Reminders Discussed with Patient:   Please bring: Picture ID, Insurance Card and Co pay (if required)   Case for Glasses, Dentures, Hearing Aides (if applicable)  Advanced Directive (if applicable)  Bring Crutches, Walker, Sling, Shoe/Boot and/or Brace as directed (if applicable)     General Patient Instructions:  Leave valuables and jewelry at home. Remove all piercings and rings.  Do not apply lotions, creams, powders, cologne/perfume, hairspray and makeup on the day of surgery  Avoid alcohol and smoking  Do not shave or use hair removal products for 24 hours prior to surgery  Wear loose-fitting and comfortable clothing.  Make arrangements for transportation home-  patients will not be allowed to drive or take public transportation alone after sedation or anesthesia.   Any patient illness within 48 hours of surgery, patient instructed to notify their surgeon prior to coming in to the hospital.  One Parent must stay in the hospital building while the child is present.

## 2021-12-27 NOTE — Progress Notes (Signed)
Pt scheduled for recurrent flank hernia repair via Dr. Joanna Puff. Pt's spouse has been dx w/lung CA, thus pt would like to r/s. Confirmed this is ok per Dr. Joanna Puff. Surgery scheduler aware and will contact pt.

## 2021-12-27 NOTE — PSS Phone Screening (Signed)
Pre-Anesthesia Evaluation    Pre-op phone visit requested by: Annitta Jersey, MD  Reason for pre-op phone visit: Patient anticipating HERNIORRHAPY, INCISIONAL, OPEN procedure.    Language Assistant  Interpreter: N/A - English is preferred language    No orders of the defined types were placed in this encounter.      History of Present Illness/Summary:        Problem List:  Medical Problems       Hospital Problem List  Date Reviewed: 12/13/2021   None        Non-Hospital Problem List  Date Reviewed: 12/13/2021            ICD-10-CM Priority Class Noted    Incisional hernia, without obstruction or gangrene K43.2   12/14/2021        Medical History   Diagnosis Date    Arthritis     Chronic obstructive pulmonary disease     bronchitis    COVID-19 vaccine administered     3x moderna    Deep venous thrombosis of distal lower extremity     Depression     Fracture     Gastroesophageal reflux disease     Post-operative nausea and vomiting     Seasonal allergic rhinitis     Penicillin, Tovaz,    Sleep apnea      Past Surgical History:   Procedure Laterality Date    APPENDECTOMY (OPEN)  as a child    HERNIA REPAIR  October 05 2021    HYSTERECTOMY  mid 1990's    SPINE SURGERY  2019    fusion        Medication List            Accurate as of December 27, 2021  2:44 PM. Always use your most recent med list.                apixaban 5 MG  Take 1 tablet (5 mg) by mouth 2 (two) times daily  Commonly known as: ELIQUIS  Notes to patient: As per md hold 2 days prior to surgery     CALCIUM PO  Take by mouth nightly  Medication Adjustments for Surgery: Hold day of surgery     diphenhydrAMINE-acetaminophen 25-500 MG Tabs  Take 1 tablet by mouth  Commonly known as: TYLENOL PM  Medication Adjustments for Surgery: Take as needed     memantine 10 MG tablet  Take 1 tablet (10 mg) by mouth 2 (two) times daily  Commonly known as: NAMENDA  Medication Adjustments for Surgery: Take morning of surgery     montelukast 10 MG tablet  Take 1 tablet (10 mg)  by mouth nightly  Commonly known as: SINGULAIR  Medication Adjustments for Surgery: Take as prescribed     Myrbetriq 25 MG Tb24  nightly  Generic drug: Mirabegron ER  Medication Adjustments for Surgery: Take as prescribed     oxyCODONE-acetaminophen 10-325 MG per tablet  Take 1 tablet by mouth every 4 (four) hours as needed for Pain  Commonly known as: PERCOCET  Medication Adjustments for Surgery: Take as needed     pantoprazole 40 MG tablet  Commonly known as: PROTONIX  Medication Adjustments for Surgery: Take morning of surgery     pravastatin 40 MG tablet  Take 2 tablets (80 mg) by mouth nightly  Commonly known as: PRAVACHOL  Medication Adjustments for Surgery: Take as prescribed     sertraline 100 MG tablet  Take 1 tablet (100 mg) by mouth  daily  Commonly known as: ZOLOFT  Medication Adjustments for Surgery: Take morning of surgery     traMADol 50 MG tablet  Take 1 tablet (50 mg) by mouth every 6 (six) hours as needed for Pain  Commonly known as: ULTRAM  Medication Adjustments for Surgery: Take as needed     VITAMIN B-12 PO  Take by mouth nightly  Medication Adjustments for Surgery: Hold day of surgery     Vitamin D 50 MCG (2000 UT) Caps  Take 1 capsule by mouth  Medication Adjustments for Surgery: Hold day of surgery            Allergies   Allergen Reactions    Hydrocortisone     Penicillins     Toviaz [Fesoterodine]      Family History   Problem Relation Age of Onset    Heart disease Father         died at age 67    Diabetes Sister     Diabetes Brother     Heart disease Brother      Social History     Occupational History    Not on file   Tobacco Use    Smoking status: Former     Packs/day: .5     Types: Cigarettes     Quit date: 04/03/2004     Years since quitting: 17.7    Smokeless tobacco: Never   Vaping Use    Vaping Use: Never used   Substance and Sexual Activity    Alcohol use: Never    Drug use: Never    Sexual activity: Never       Menstrual History:   LMP / Status  Menopausal     No LMP recorded.  (Menstrual status: Menopausal).    Tubal Ligation?  No valid surgical or medical questions entered.             Exam Scores:   SDB score Risk Category: OSA Diagnosed With PAP    PONV score Nausea Risk: SEVERE RISK    MST score MST Score: 0    Allergy score Risk Category: Moderate Risk    Frailty score CFS Score: 3       Visit Vitals  Ht 1.575 m (5\' 2" )   Wt 72.6 kg (160 lb)   BMI 29.26 kg/m

## 2022-01-06 NOTE — PSS Phone Screening (Signed)
PCP LOV notes from 11/2021 scanned into epic media  CBC , CMP and UA _ abnormal - imbedded in PCP LOV note  CT Abdomen Pelvis imbedded in PCP LOV note

## 2022-01-06 NOTE — Telephone Encounter (Signed)
Daughter, on HIPPA, calling to confirm surgery w/Dr. Joanna Puff on 01/09/2022 will be canceled. Confirmed w/surgery scheduler, she is aware and will inform hospital at this time. Daughter states understanding and will call back to r/s.

## 2022-01-09 ENCOUNTER — Inpatient Hospital Stay: Admission: RE | Admit: 2022-01-09 | Payer: Medicare PPO | Source: Ambulatory Visit | Admitting: Surgery

## 2022-01-09 ENCOUNTER — Encounter: Admission: RE | Payer: Self-pay | Source: Ambulatory Visit

## 2022-01-09 DIAGNOSIS — K469 Unspecified abdominal hernia without obstruction or gangrene: Secondary | ICD-10-CM

## 2022-01-09 DIAGNOSIS — K432 Incisional hernia without obstruction or gangrene: Secondary | ICD-10-CM | POA: Insufficient documentation

## 2022-01-09 SURGERY — HERNIORRHAPY, INCISIONAL
Anesthesia: General | Site: Abdomen

## 2022-01-25 ENCOUNTER — Ambulatory Visit: Payer: Medicare PPO | Admitting: Surgery

## 2022-03-02 ENCOUNTER — Encounter: Payer: Self-pay | Admitting: Surgery

## 2022-03-02 ENCOUNTER — Other Ambulatory Visit: Payer: Self-pay | Admitting: Surgery

## 2022-03-02 DIAGNOSIS — K432 Incisional hernia without obstruction or gangrene: Secondary | ICD-10-CM

## 2022-03-02 NOTE — Progress Notes (Signed)
Open Inguinal hernia repair 3cm to 10 cm incarcerated/strangulated, Left    ICD 10 K43.2  CPT code 35573    Posting sent to [facility] for 04/13/22 at 1000    Prior auth to be obtain by [facility] billing     Surgery scheduling details discussed with patient's daughter listed under HIPPA

## 2022-03-29 DIAGNOSIS — S022XXA Fracture of nasal bones, initial encounter for closed fracture: Secondary | ICD-10-CM

## 2022-03-29 DIAGNOSIS — W19XXXA Unspecified fall, initial encounter: Secondary | ICD-10-CM

## 2022-03-29 HISTORY — DX: Fracture of nasal bones, initial encounter for closed fracture: S02.2XXA

## 2022-03-29 HISTORY — DX: Unspecified fall, initial encounter: W19.XXXA

## 2022-03-31 ENCOUNTER — Ambulatory Visit: Payer: Medicare PPO

## 2022-03-31 ENCOUNTER — Encounter: Payer: Self-pay | Admitting: Surgery

## 2022-03-31 NOTE — PSS Phone Screening (Addendum)
Surgeon Testing Requirements:      Anesthesia Guideline Requirements:  Messaged Wendee Beavers with surgeon, Karle Starch and Ellsworth Lennox with anesthesia to notify of pts recent fall and nasal fracture- spoke to Janyth Pupa at surgeons office    Documentation already in chart:  12/01/21 pcp note- epic media    Faxes Sent / Other Specialist Notes, Results, Records Requested:  Megan Mans- last note and ekg    Future Plan / Upcoming Appts / Labs or Other Testing at [facility]:   04/04/21 Pt has fu with pcp for nasal fracture    Epic Orders:   Day of Surgery (DOS) order set entered  []  Urine HCG      E-mail Sent To Patient: Patient verbally consented to have post interview email sent to email address below:  [email or web address]   Emailed information included:   [x]  PSS Points of Contact: [email or web address] or phone [phone] to patient/family member          [x]  NPO Instructions per Preoperative Fasting Guidelines for Elective Surgeries and Procedures Requiring Anesthesia Policy   [x]  Reviewed Visitor policy   []  Preparing for surgery brochure or other specific surgery brochure (Joints, Shoulder, Spine)         []  Pre Procedural Skin Prep CHG Instructions   []  Bariatric Education Bundle Information   []  Patient requests no e-mail and will access AVS, PSS education/brochure/hibiclens instructions via link in my chart      Pre-Anesthesia Evaluation    Pre-op phone visit requested by:   Reason for pre-op phone visit: Patient anticipating HERNIORRHAPY, INCISIONAL procedure.         No orders of the defined types were placed in this encounter.      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     years ago, pt on eliquis    Dementia     early onset, alert to person, place and time, signs own consent    Depression     Dyspnea on exertion 11/2021    per daughter, pt no longer having sob    Fall 03/29/2022    bending over to pick something up and lost balance, broke nose    Fracture     Gastroesophageal reflux disease     Incisional hernia without obstruction or gangrene     preop dx    Nasal fracture 03/29/2022    from fall    Post-operative nausea and vomiting     Seasonal allergic rhinitis     Sleep apnea     uses cpap intermittently     Past Surgical History:   Procedure Laterality Date    APPENDECTOMY (OPEN)  as a child    COLONOSCOPY      FOOT SURGERY      HERNIA REPAIR  10/05/2021    HYSTERECTOMY  mid 1990's    SPINE SURGERY  2019    fusion        Medication List            Accurate as of March 31, 2022  8:15 AM. Always use your most recent med list.                acetaminophen 650 MG CR tablet  Take 1 tablet (650 mg) by mouth every 8 (eight) hours as needed for Pain  Commonly known as: TYLENOL  Medication Adjustments for Surgery: Take as needed     apixaban 5 MG  Take 1 tablet (5 mg) by mouth 2 (two) times daily  Commonly known as: ELIQUIS  Medication Adjustments for Surgery: Stop 2 days before surgery     CALCIUM PO  Take by mouth nightly  Medication Adjustments for Surgery: Hold day of surgery     memantine 10 MG tablet  Take 1 tablet (10 mg) by mouth 2 (two) times daily  Commonly known as: NAMENDA  Medication Adjustments for Surgery: Take morning of surgery     Myrbetriq 25 MG Tb24  nightly  Generic drug: Mirabegron ER  Medication Adjustments for Surgery: Take as prescribed     oxyCODONE-acetaminophen 10-325 MG per tablet  Take 1 tablet by mouth every 4 (four) hours as needed for Pain  Commonly known as: PERCOCET  Medication  Adjustments for Surgery: Take as needed     pantoprazole 40 MG tablet  Commonly known as: PROTONIX  Medication Adjustments for Surgery: Take morning of surgery     pravastatin 40 MG tablet  Take 2 tablets (80 mg) by mouth nightly  Commonly known as: PRAVACHOL  Medication Adjustments for Surgery: Take as prescribed     sertraline 100 MG tablet  Take 1 tablet (100 mg) by mouth daily  Commonly known as: ZOLOFT  Medication Adjustments for Surgery: Take morning of surgery     traMADol 50 MG tablet  Take 1 tablet (50 mg) by mouth every 6 (six) hours as needed for Pain  Commonly known as: ULTRAM  Medication Adjustments for Surgery: Take as needed     VITAMIN B-12 PO  Take by mouth nightly  Medication Adjustments for Surgery: Hold day of surgery     Vitamin D 50 MCG (2000 UT) Caps  Take 1 capsule by mouth  Medication Adjustments for Surgery: Hold day of surgery            Allergies   Allergen Reactions    Hydrocortisone      Happened as infant unknown reaction    Penicillins      Happened as infant unknown reaction    Toviaz [Fesoterodine]      unknown reaction     Family History   Problem Relation Age of Onset    Heart disease Father         died at age 23    Diabetes Sister     Diabetes Brother     Heart disease Brother      Social History     Occupational History    Not on file   Tobacco Use    Smoking status: Former     Packs/day: .5     Types: Cigarettes     Quit date: 04/03/2004     Years since quitting: 18.0    Smokeless tobacco: Never   Vaping Use    Vaping Use: Never used   Substance  and Sexual Activity    Alcohol use: Never    Drug use: Never    Sexual activity: Never       Menstrual History:   LMP / Status  Postmenopausal     No LMP recorded. Patient is postmenopausal.    Tubal Ligation?  No valid surgical or medical questions entered.           Exam Scores:   SDB score  Risk Category: OSA Diagnosed With PAP    STBUR score       PONV score  Nausea Risk: VERY SEVERE RISK    MST score  MST Score: 0    Allergy score        Frailty score  CFS Score: 4         Visit Vitals  Ht 1.549 m (5\' 1" )   Wt 90.7 kg (200 lb)   BMI 37.79 kg/m

## 2022-04-04 ENCOUNTER — Telehealth: Payer: Self-pay

## 2022-04-04 NOTE — Progress Notes (Signed)
12.29.2023 9:49am - message received notifying patient fell and broke nose on 12.27.2023.      12.29.2023 10:12am - MGS messaged to advise how to proceed.    12.29.2023 2:15pm - per MGS, surgery should wait at least one month - end of January for scheduling.    01.02.2024 2:29pm - contacted patient's daughter.  Surgery rescheduled.    Open Incisional Hernia Repair on 02.01.2024 8:00am with MGS @ [facility]    DX:  K43.2  CPT:  81859    Change request submitted.    Updated surgery scheduling details confirmed with patient's daughter.  Post operative appointment scheduled.  Advised pre-operative appointment may be needed.  Will follow up with patient's daughter if pre-operative appointment is needed.  MyChart message sent.      01.02.2024 3:57pm - per MGS, pre-operative appointment not needed.    01.03.2024 1:58pm - contacted patient's daughter.  Notified pre-operative appointment is not needed.  Daughter verbalized understanding and offered no further questions.

## 2022-04-04 NOTE — Telephone Encounter (Signed)
Called pt to review instructions to hold Eliquis x48 hours before upcoming surgery 1/11 with Dr. Sydell Axon.    Last dose - 04/10/22  Hold - 1/9, 1/10, 1/11  Confirm with prescribing provider and cb if any questions or concerns.  Resume after surgery as instructed by surgeon.    Spoke to [place] who states that patient stopped the Eliquis last week after breaking her nose and will not take it until after surgery.  No further questions.

## 2022-04-13 DIAGNOSIS — K432 Incisional hernia without obstruction or gangrene: Secondary | ICD-10-CM | POA: Insufficient documentation

## 2022-04-24 ENCOUNTER — Encounter (INDEPENDENT_AMBULATORY_CARE_PROVIDER_SITE_OTHER): Payer: Self-pay

## 2022-04-25 ENCOUNTER — Encounter (INDEPENDENT_AMBULATORY_CARE_PROVIDER_SITE_OTHER): Payer: Self-pay

## 2022-04-25 ENCOUNTER — Ambulatory Visit (INDEPENDENT_AMBULATORY_CARE_PROVIDER_SITE_OTHER): Payer: Medicare PPO

## 2022-04-25 ENCOUNTER — Ambulatory Visit: Payer: Medicare PPO | Admitting: Surgery

## 2022-04-25 NOTE — PSS Phone Screening (Addendum)
Pre-Anesthesia Evaluation    Pre-op phone visit requested by: Barnet Glasgow, MD  Reason for pre-op phone visit: Patient anticipating HERNIORRHAPY, INCISIONAL procedure.       Surgeon Testing Requirements:  None    Anesthesia Guideline Requirements:  Anesthesia review    Documentation already in chart:  LON cardio 11/25/2021 in [employer] / Other Specialist Notes, Results, Records Requested:  Called pulmonary [phone] for LON/pulmonary testing - left message to have documents faxed to [phone]    Epic Orders:   Day of Surgery (DOS) order set entered  []  Urine HCG      E-mail Sent To Patient: Patient verbally consented to have post interview email sent to email address below:  [email or web address]   Emailed information included:   [x]  PSS Points of Contact: [email or web address] or phone [phone] to patient/family member          [x]  NPO Instructions per Preoperative Fasting Guidelines for Elective Surgeries and Procedures Requiring Anesthesia Policy   [x]  Reviewed Visitor policy   [x]  Preparing for surgery brochure or other specific surgery brochure (Joints, Shoulder, Spine)         [x]  Pre Procedural Skin Prep CHG Instructions   []  Bariatric Education Bundle Information   []  Patient requests no e-mail and will access AVS, PSS education/brochure/hibiclens instructions via link in my chart        History of Present Illness/Summary:        Problem List:  [facility] Problem List  Date Reviewed: 12/13/2021   None        Non-Hospital Problem List  Date Reviewed: 12/13/2021            ICD-10-CM Priority Class Noted    Incisional hernia, without obstruction or gangrene K43.2   12/14/2021        Medical History   Diagnosis Date    Arthritis     Chronic obstructive pulmonary disease     possible dx years ago; recent pulmonary workup was negative per daughter    COVID-19 vaccine administered     3x moderna    COVID-19 vaccine series started     x2? unknown brand    Deep venous  thrombosis of distal lower extremity     years ago, unprovoked, pt on Eliquis    Dementia     early onset, alert to person, place and time, signs own consent    Depression     Dyspnea on exertion 11/2021    per daughter, pt no longer having sob    Fall 03/29/2022    bending over to pick something up and lost balance, broke nose    Gastroesophageal reflux disease     Incisional hernia without obstruction or gangrene     preop dx    Nasal fracture 03/29/2022    from fall    Post-operative nausea and vomiting     mild    Seasonal allergic rhinitis     Sleep apnea     uses cpap intermittently     Past Surgical History:   Procedure Laterality Date    APPENDECTOMY (OPEN)  as a child    bladder stimulator implant      COLONOSCOPY      FOOT SURGERY Bilateral     HERNIA REPAIR  10/05/2021    HYSTERECTOMY  mid 1990's    SPINE SURGERY  2019    fusion  Medication List            Accurate as of April 25, 2022  2:38 PM. Always use your most recent med list.                acetaminophen 650 MG CR tablet  Take 1 tablet (650 mg) by mouth every 8 (eight) hours as needed for Pain  Commonly known as: TYLENOL  Medication Adjustments for Surgery: Take as needed     apixaban 5 MG  Take 1 tablet (5 mg) by mouth 2 (two) times daily  Commonly known as: ELIQUIS  Medication Adjustments for Surgery: Stop 2 days before surgery  Notes to patient: Per surgeon's direction     CALCIUM PO  Take 1 tablet by mouth nightly  Medication Adjustments for Surgery: Take as prescribed     memantine 10 MG tablet  Take 1 tablet (10 mg) by mouth 2 (two) times daily  Commonly known as: NAMENDA  Medication Adjustments for Surgery: Take morning of surgery     Myrbetriq 25 MG Tb24  Take 1 tablet (25 mg) by mouth nightly  Generic drug: Mirabegron ER  Medication Adjustments for Surgery: Take as prescribed     oxyCODONE-acetaminophen 10-325 MG per tablet  Take 1 tablet by mouth every 4 (four) hours as needed for Pain  Commonly known as: PERCOCET  Medication  Adjustments for Surgery: Take as needed     pantoprazole 40 MG tablet  Take 1 tablet (40 mg) by mouth daily  Commonly known as: PROTONIX  Medication Adjustments for Surgery: Take morning of surgery     pravastatin 40 MG tablet  Take 2 tablets (80 mg) by mouth nightly  Commonly known as: PRAVACHOL  Medication Adjustments for Surgery: Take as prescribed     sertraline 100 MG tablet  Take 1 tablet (100 mg) by mouth daily  Commonly known as: ZOLOFT  Medication Adjustments for Surgery: Take morning of surgery     traMADol 50 MG tablet  Take 1 tablet (50 mg) by mouth every 6 (six) hours as needed for Pain  Commonly known as: ULTRAM  Medication Adjustments for Surgery: Take as needed     VITAMIN B-12 PO  Take 1 tablet by mouth nightly  Medication Adjustments for Surgery: Take as prescribed     Vitamin D 50 MCG (2000 UT) Caps  Take 1 capsule by mouth daily  Medication Adjustments for Surgery: Hold day of surgery            Allergies   Allergen Reactions    Hydrocortisone      Happened as infant unknown reaction    Penicillins      Happened as infant unknown reaction    Toviaz [Fesoterodine]      unknown reaction     Family History   Problem Relation Age of Onset    Heart disease Father         died at age 81    Diabetes Sister     Diabetes Brother     Heart disease Brother      Social History     Occupational History    Not on file   Tobacco Use    Smoking status: Former     Packs/day: .5     Types: Cigarettes     Quit date: 04/03/2004     Years since quitting: 18.0    Smokeless tobacco: Never    Tobacco comments:     Secondhand smoke; occasional smoker  Vaping Use    Vaping Use: Never used   Substance and Sexual Activity    Alcohol use: Never    Drug use: Never    Sexual activity: Never       Menstrual History:   LMP / Status  Postmenopausal     No LMP recorded. Patient is postmenopausal.    Tubal Ligation?  No valid surgical or medical questions entered.           Exam Scores:   SDB score  Risk Category: OSA Diagnosed With  PAP    STBUR score       PONV score  Nausea Risk: SEVERE RISK    MST score  MST Score: 0    PEN-FAST score       Frailty score  CFS Score: 5    CHADsVasc            Visit Vitals  Ht 1.575 m (5\' 2" )   Wt 79.4 kg (175 lb)   BMI 32.01 kg/m

## 2022-04-26 ENCOUNTER — Telehealth: Payer: Self-pay

## 2022-04-26 NOTE — Telephone Encounter (Signed)
Calling pt to confirm she will hold her Eliquis before upcoming surgery 2/1 with Dr. Sydell Axon -    Last dose - 1/29    Hold - 1/30-2/1    Resume after surgery as instructed by MD.  Harlow Mares with prescribing MD.    Daughter (HIPAA) states understanding of above instructions and states that the patient has come off Eliquis before when needed.  No further questions.

## 2022-04-28 NOTE — PSS Phone Screening (Addendum)
Pulm LOV note from 09/15/19 in epic media.    Called patient to confirm when last pulmonary visit was.  Pt's daughter stated she saw a pulmonary MD in 2023 and her work up was totally clear.  She states her father managed all her moms care and died in 02/05/2022 so she is playing catch up with her moms care and the pulm visit was prior to her fathers death.  She recommended we call her moms PCP Dr Arita Miss at [phone] to see if they know who the pulm  MD was that she saw. She states her mom's memory is poor and she does not remember the MD name.    Called patients PCP [phone], unable to reach anyone.  Will try back after 1pm.    Addendum - 1:05 pm - called PCP office, spoke to office staff.  They will send a message to an RN to look into it and call us back.

## 2022-05-03 ENCOUNTER — Encounter: Payer: Self-pay | Admitting: Surgery

## 2022-05-03 NOTE — Anesthesia Preprocedure Evaluation (Incomplete Revision)
18 YOF BMI 32, OSA, COPD, HLD, GERD, dysphagia, AD/vascular dementia, MDD, migraines, OA, DVT history, chronic OAC, PONV - incisional hernia  - Also adrenal mass and pulm nodule  - Negative stress test history and zio patch showing 9 episodes of SVT    EKG pending    PSS Anesthesia Comments: COPD, sleep apnea- intermittent use of CPAP, broken nose 03/29/2022, GERD, DVT of DLE - years ago- on Eliquis, early onset dementia- orientated to person, place and time.  Pulmonologist OV 2021 Eula Fried) discussing chest CT scans from 08/2019 which showed no acute pulmonary process, multiple micronodules.    Per daughter, patient asaw pulmonologist in 2023 and the work up was totally clear.  Will hold Eliquis 1/30-2/1.  GB        Anesthesia Plan    ASA 3     general               (Risks discussed including but not limited to:    - Neurological complications such as stroke, TIA  - Cardiovascular complications such as heart attack, Arrhythmias, Cardiac arrest   - Pulmonary complications such as Asthmatic attack,Pulm. Aspiration, Bronchospasm and Pneumonia  - Intra-operative awareness,  - Dental Injuries  - Sore Throat.  - Allergic reactions.  - Death.     Anesthesia explained and Questions answered.     Pt understands and wishes to proceed.    Eino Farber, MD)      intravenous induction   Detailed anesthesia plan: general IV        Post op pain management: per surgeon    informed consent obtained    Plan discussed with CRNA.      pertinent labs reviewed               Signed by: Karie Fetch, CRNA 05/03/22 3:12 PM

## 2022-05-03 NOTE — Anesthesia Preprocedure Evaluation (Signed)
Relevant Problems   No relevant active problems       PSS Anesthesia Comments: COPD, sleep apnea- intermittent use of CPAP, broken nose 03/29/2022, GERD, DVT of DLE - years ago- on Eliquis, early onset dementia- orientated to person, place and time.  Pulmonologist OV 2021 Eula Fried) discussing chest CT scans from 08/2019 which showed no acute pulmonary process, multiple micronodules.    Per daughter, patient asaw pulmonologist in 2023 and the work up was totally clear.  Will hold Eliquis 1/30-2/1.  GB    Pre-evaluation Note Incomplete - DO NOT USE FOR CLINICAL DECISIONS    Anesthesia Plan                                               Signed by: Karie Fetch, CRNA 05/03/22 3:12 PM

## 2022-05-04 ENCOUNTER — Ambulatory Visit: Payer: Medicare PPO | Admitting: Anesthesiology

## 2022-05-04 ENCOUNTER — Ambulatory Visit: Payer: Self-pay

## 2022-05-04 ENCOUNTER — Inpatient Hospital Stay
Admission: RE | Admit: 2022-05-04 | Discharge: 2022-05-06 | DRG: 394 | Disposition: A | Discharge status: Home / Self Care | Payer: Medicare PPO | Attending: Surgery | Admitting: Surgery

## 2022-05-04 ENCOUNTER — Encounter: Payer: Self-pay | Admitting: Surgery

## 2022-05-04 ENCOUNTER — Encounter
Admission: RE | Disposition: A | Discharge status: Home / Self Care | Payer: Self-pay | Source: Home / Self Care | Attending: Surgery

## 2022-05-04 DIAGNOSIS — M1990 Unspecified osteoarthritis, unspecified site: Secondary | ICD-10-CM | POA: Diagnosis present

## 2022-05-04 DIAGNOSIS — F0393 Unspecified dementia, unspecified severity, with mood disturbance: Secondary | ICD-10-CM | POA: Diagnosis present

## 2022-05-04 DIAGNOSIS — Z8719 Personal history of other diseases of the digestive system: Secondary | ICD-10-CM

## 2022-05-04 DIAGNOSIS — J449 Chronic obstructive pulmonary disease, unspecified: Secondary | ICD-10-CM | POA: Diagnosis present

## 2022-05-04 DIAGNOSIS — K430 Incisional hernia with obstruction, without gangrene: Principal | ICD-10-CM | POA: Diagnosis present

## 2022-05-04 DIAGNOSIS — Z86718 Personal history of other venous thrombosis and embolism: Secondary | ICD-10-CM

## 2022-05-04 DIAGNOSIS — Z79899 Other long term (current) drug therapy: Secondary | ICD-10-CM

## 2022-05-04 DIAGNOSIS — K432 Incisional hernia without obstruction or gangrene: Principal | ICD-10-CM | POA: Insufficient documentation

## 2022-05-04 DIAGNOSIS — Z87891 Personal history of nicotine dependence: Secondary | ICD-10-CM

## 2022-05-04 DIAGNOSIS — K219 Gastro-esophageal reflux disease without esophagitis: Secondary | ICD-10-CM | POA: Diagnosis present

## 2022-05-04 DIAGNOSIS — Z7901 Long term (current) use of anticoagulants: Secondary | ICD-10-CM

## 2022-05-04 DIAGNOSIS — G4730 Sleep apnea, unspecified: Secondary | ICD-10-CM | POA: Diagnosis present

## 2022-05-04 DIAGNOSIS — F32A Depression, unspecified: Secondary | ICD-10-CM | POA: Diagnosis present

## 2022-05-04 DIAGNOSIS — Z9889 Other specified postprocedural states: Secondary | ICD-10-CM

## 2022-05-04 HISTORY — PX: HERNIORRHAPY, INCISIONAL: SHX4203

## 2022-05-04 HISTORY — PX: REMOVAL, STERNAL WIRE: SHX5166

## 2022-05-04 SURGERY — HERNIORRHAPY, INCISIONAL
Anesthesia: Anesthesia General | Site: Abdomen | Wound class: Clean

## 2022-05-04 MED ORDER — OXYCODONE HCL 5 MG PO TABS
5.0000 mg | ORAL_TABLET | ORAL | Status: DC | PRN
Start: 2022-05-04 — End: 2022-05-06
  Administered 2022-05-04 – 2022-05-05 (×7): 5 mg via ORAL
  Filled 2022-05-04 (×8): qty 1

## 2022-05-04 MED ORDER — ACETAMINOPHEN 10 MG/ML IV SOLN
INTRAVENOUS | Status: DC | PRN
Start: 2022-05-04 — End: 2022-05-04
  Administered 2022-05-04: 1000 mg via INTRAVENOUS

## 2022-05-04 MED ORDER — HEPARIN SODIUM (PORCINE) 5000 UNIT/ML IJ SOLN
5000.0000 [IU] | Freq: Once | INTRAMUSCULAR | Status: AC
Start: 2022-05-04 — End: 2022-05-04
  Administered 2022-05-04: 5000 [IU] via SUBCUTANEOUS

## 2022-05-04 MED ORDER — HEPARIN SODIUM (PORCINE) 5000 UNIT/ML IJ SOLN
INTRAMUSCULAR | Status: AC
Start: 2022-05-04 — End: ?
  Filled 2022-05-04: qty 1

## 2022-05-04 MED ORDER — VANCOMYCIN 1000 MG IN 250 ML NS IVPB VIAL-MATE (CNR)
1000.0000 mg | INTRAVENOUS | Status: AC
Start: 2022-05-04 — End: 2022-05-04
  Administered 2022-05-04: 1000 mg via INTRAVENOUS

## 2022-05-04 MED ORDER — ONDANSETRON HCL 4 MG/2ML IJ SOLN
INTRAMUSCULAR | Status: AC
Start: 2022-05-04 — End: ?
  Filled 2022-05-04: qty 2

## 2022-05-04 MED ORDER — LACTATED RINGERS IV SOLN
INTRAVENOUS | Status: DC
Start: 2022-05-04 — End: 2022-05-06

## 2022-05-04 MED ORDER — DIPHENHYDRAMINE HCL 50 MG/ML IJ SOLN
25.0000 mg | INTRAMUSCULAR | Status: DC | PRN
Start: 2022-05-04 — End: 2022-05-06

## 2022-05-04 MED ORDER — ONDANSETRON HCL 4 MG PO TABS
4.0000 mg | ORAL_TABLET | Freq: Four times a day (QID) | ORAL | Status: DC | PRN
Start: 2022-05-04 — End: 2022-05-06
  Filled 2022-05-04: qty 1

## 2022-05-04 MED ORDER — BUPIVACAINE HCL (PF) 0.25 % IJ SOLN
INTRAMUSCULAR | Status: AC
Start: 2022-05-04 — End: ?
  Filled 2022-05-04: qty 30

## 2022-05-04 MED ORDER — EPHEDRINE SULFATE 50 MG/ML IJ/IV SOLN (WRAP)
Status: AC
Start: 2022-05-04 — End: ?
  Filled 2022-05-04: qty 1

## 2022-05-04 MED ORDER — VANCOMYCIN 1000 MG IN 250 ML NS IVPB VIAL-MATE (CNR)
INTRAVENOUS | Status: AC
Start: 2022-05-04 — End: ?
  Filled 2022-05-04: qty 250

## 2022-05-04 MED ORDER — SODIUM CHLORIDE 0.9% BAG (IRRIGATION USE)
INTRAVENOUS | Status: DC | PRN
Start: 2022-05-04 — End: 2022-05-04
  Administered 2022-05-04: 1000 mL

## 2022-05-04 MED ORDER — ONDANSETRON HCL 4 MG/2ML IJ SOLN
4.0000 mg | Freq: Four times a day (QID) | INTRAMUSCULAR | Status: DC | PRN
Start: 2022-05-04 — End: 2022-05-06
  Administered 2022-05-04 – 2022-05-05 (×5): 4 mg via INTRAVENOUS
  Filled 2022-05-04 (×5): qty 2

## 2022-05-04 MED ORDER — OXYCODONE HCL 5 MG PO TABS
5.0000 mg | ORAL_TABLET | Freq: Once | ORAL | Status: DC | PRN
Start: 2022-05-04 — End: 2022-05-04

## 2022-05-04 MED ORDER — FENTANYL CITRATE (PF) 50 MCG/ML IJ SOLN (WRAP)
25.0000 ug | INTRAMUSCULAR | Status: AC | PRN
Start: 2022-05-04 — End: 2022-05-04
  Administered 2022-05-04 (×3): 25 ug via INTRAVENOUS

## 2022-05-04 MED ORDER — LIDOCAINE HCL 2 % IJ SOLN
INTRAMUSCULAR | Status: AC
Start: 2022-05-04 — End: ?
  Filled 2022-05-04: qty 20

## 2022-05-04 MED ORDER — KETOROLAC TROMETHAMINE 30 MG/ML IJ SOLN
15.0000 mg | Freq: Four times a day (QID) | INTRAMUSCULAR | Status: DC | PRN
Start: 2022-05-04 — End: 2022-05-06
  Administered 2022-05-04 – 2022-05-06 (×2): 15 mg via INTRAVENOUS
  Filled 2022-05-04 (×2): qty 1

## 2022-05-04 MED ORDER — ACETAMINOPHEN 10 MG/ML IV SOLN
INTRAVENOUS | Status: AC
Start: 2022-05-04 — End: ?
  Filled 2022-05-04: qty 100

## 2022-05-04 MED ORDER — HEPARIN SODIUM (PORCINE) 5000 UNIT/ML IJ SOLN
5000.0000 [IU] | Freq: Two times a day (BID) | INTRAMUSCULAR | Status: DC
Start: 2022-05-04 — End: 2022-05-06
  Administered 2022-05-04 – 2022-05-06 (×5): 5000 [IU] via SUBCUTANEOUS
  Filled 2022-05-04 (×5): qty 1

## 2022-05-04 MED ORDER — DEXAMETHASONE SODIUM PHOSPHATE 4 MG/ML IJ SOLN (WRAP)
INTRAMUSCULAR | Status: DC | PRN
Start: 2022-05-04 — End: 2022-05-04
  Administered 2022-05-04: 8 mg via INTRAVENOUS

## 2022-05-04 MED ORDER — HYDROMORPHONE HCL 0.5 MG/0.5 ML IJ SOLN
0.5000 mg | INTRAMUSCULAR | Status: DC | PRN
Start: 2022-05-04 — End: 2022-05-04

## 2022-05-04 MED ORDER — PHENYLEPHRINE 100 MCG/ML IV BOLUS (ANESTHESIA)
PREFILLED_SYRINGE | INTRAVENOUS | Status: DC | PRN
Start: 2022-05-04 — End: 2022-05-04
  Administered 2022-05-04 (×2): 100 ug via INTRAVENOUS

## 2022-05-04 MED ORDER — ONDANSETRON HCL 4 MG/2ML IJ SOLN
INTRAMUSCULAR | Status: DC | PRN
Start: 2022-05-04 — End: 2022-05-04
  Administered 2022-05-04: 4 mg via INTRAVENOUS

## 2022-05-04 MED ORDER — LIDOCAINE HCL 2 % IJ SOLN
INTRAMUSCULAR | Status: DC | PRN
Start: 2022-05-04 — End: 2022-05-04
  Administered 2022-05-04: 100 mg via INTRAVENOUS

## 2022-05-04 MED ORDER — FENTANYL CITRATE (PF) 50 MCG/ML IJ SOLN (WRAP)
INTRAMUSCULAR | Status: DC | PRN
Start: 2022-05-04 — End: 2022-05-04
  Administered 2022-05-04: 25 ug via INTRAVENOUS
  Administered 2022-05-04 (×2): 50 ug via INTRAVENOUS
  Administered 2022-05-04: 25 ug via INTRAVENOUS
  Administered 2022-05-04: 50 ug via INTRAVENOUS

## 2022-05-04 MED ORDER — PROPOFOL 10 MG/ML IV EMUL (WRAP)
INTRAVENOUS | Status: AC
Start: 2022-05-04 — End: ?
  Filled 2022-05-04: qty 40

## 2022-05-04 MED ORDER — FENTANYL CITRATE (PF) 50 MCG/ML IJ SOLN (WRAP)
INTRAMUSCULAR | Status: AC
Start: 2022-05-04 — End: 2022-05-04
  Administered 2022-05-04: 25 ug via INTRAVENOUS
  Filled 2022-05-04: qty 2

## 2022-05-04 MED ORDER — BUPIVACAINE HCL 0.25 % IJ SOLN
INTRAMUSCULAR | Status: DC | PRN
Start: 2022-05-04 — End: 2022-05-04
  Administered 2022-05-04: 30 mL

## 2022-05-04 MED ORDER — FENTANYL CITRATE (PF) 50 MCG/ML IJ SOLN (WRAP)
INTRAMUSCULAR | Status: AC
Start: 2022-05-04 — End: ?
  Filled 2022-05-04: qty 2

## 2022-05-04 MED ORDER — HYDROMORPHONE HCL 0.5 MG/0.5 ML IJ SOLN
INTRAMUSCULAR | Status: AC
Start: 2022-05-04 — End: 2022-05-04
  Administered 2022-05-04: 0.5 mg
  Filled 2022-05-04: qty 0.5

## 2022-05-04 MED ORDER — STERILE WATER FOR INJECTION IJ SOLN
INTRAMUSCULAR | Status: AC
Start: 2022-05-04 — End: ?
  Filled 2022-05-04: qty 10

## 2022-05-04 MED ORDER — ROCURONIUM BROMIDE 10 MG/ML IV SOLN (WRAP)
INTRAVENOUS | Status: DC | PRN
Start: 2022-05-04 — End: 2022-05-04
  Administered 2022-05-04: 20 mg via INTRAVENOUS
  Administered 2022-05-04: 50 mg via INTRAVENOUS

## 2022-05-04 MED ORDER — NALOXONE HCL 0.4 MG/ML IJ SOLN (WRAP)
0.2000 mg | INTRAMUSCULAR | Status: DC | PRN
Start: 2022-05-04 — End: 2022-05-06

## 2022-05-04 MED ORDER — ACETAMINOPHEN 500 MG PO TABS
500.0000 mg | ORAL_TABLET | Freq: Once | ORAL | Status: DC | PRN
Start: 2022-05-04 — End: 2022-05-04

## 2022-05-04 MED ORDER — ROCURONIUM BROMIDE 50 MG/5ML IV SOLN
INTRAVENOUS | Status: AC
Start: 2022-05-04 — End: ?
  Filled 2022-05-04: qty 10

## 2022-05-04 MED ORDER — ROCURONIUM BROMIDE 50 MG/5ML IV SOLN
INTRAVENOUS | Status: AC
Start: 2022-05-04 — End: ?
  Filled 2022-05-04: qty 5

## 2022-05-04 MED ORDER — PROPOFOL 10 MG/ML IV EMUL (WRAP)
INTRAVENOUS | Status: AC
Start: 2022-05-04 — End: ?
  Filled 2022-05-04: qty 20

## 2022-05-04 MED ORDER — ONDANSETRON HCL 4 MG/2ML IJ SOLN
4.0000 mg | Freq: Once | INTRAMUSCULAR | Status: DC | PRN
Start: 2022-05-04 — End: 2022-05-04

## 2022-05-04 MED ORDER — DEXAMETHASONE SODIUM PHOSPHATE 4 MG/ML IJ SOLN
INTRAMUSCULAR | Status: AC
Start: 2022-05-04 — End: ?
  Filled 2022-05-04: qty 2

## 2022-05-04 MED ORDER — SUGAMMADEX SODIUM 200 MG/2ML IV SOLN
INTRAVENOUS | Status: DC | PRN
Start: 2022-05-04 — End: 2022-05-04
  Administered 2022-05-04: 200 mg via INTRAVENOUS

## 2022-05-04 MED ORDER — PROPOFOL 10 MG/ML IV EMUL (WRAP)
INTRAVENOUS | Status: DC | PRN
Start: 2022-05-04 — End: 2022-05-04
  Administered 2022-05-04: 120 mg via INTRAVENOUS

## 2022-05-04 MED ORDER — SUGAMMADEX SODIUM 200 MG/2ML IV SOLN
INTRAVENOUS | Status: AC
Start: 2022-05-04 — End: ?
  Filled 2022-05-04: qty 2

## 2022-05-04 MED ORDER — ACETAMINOPHEN 325 MG PO TABS
650.0000 mg | ORAL_TABLET | Freq: Four times a day (QID) | ORAL | Status: DC
Start: 2022-05-04 — End: 2022-05-06
  Administered 2022-05-04 – 2022-05-06 (×8): 650 mg via ORAL
  Filled 2022-05-04 (×9): qty 2

## 2022-05-04 SURGICAL SUPPLY — 142 items
ADHESIVE SKIN CLOSURE DERMABOND ADVANCED (Skin Closure) ×2
ADHESIVE SKIN CLOSURE DERMABOND ADVANCED .7 ML LIQUID APPLICATOR (Skin Closure) ×2 IMPLANT
ADHESIVE SKNCLS 2 OCTYL CYNCRLT .7ML (Skin Closure) ×2
APPLICATOR CHLORAPREP 26 ML 70% ISOPROPYL ALCOHOL 2% CHLORHEXIDINE (Applicator) ×2 IMPLANT
APPLICATOR PRP 70% ISPRP 2% CHG 26ML (Applicator) ×2
BINDER ABD LG XL STD UNV 46-62INX12IN LF (Patient Supply)
BINDER ABD SM MED 30-45IN 12IN LF NS (Patient Supply)
BINDER ABD SM MED STD 30-45IN 12IN LF 4 (Patient Supply)
BINDER ABDOMINAL H12 IN SMALL MEDIUM (Patient Supply)
BINDER ABDOMINAL H12 IN SMALL MEDIUM STANDARD 4 PANEL ELASTIC FLEXIBLE (Patient Supply) IMPLANT
BINDER ABDOMINAL L46-62 IN X H12 IN (Patient Supply)
BINDER ABDOMINAL L46-62IN X H12IN LG XL STD UNIVERSL 4 PNL FLXBL MEDLN (Patient Supply) IMPLANT
BINDER ABDOMINAL W12 IN SMALL MEDIUM (Patient Supply)
BINDER ABDOMINAL W12 IN SMALL MEDIUM MULTIPANEL HOOK LOOP CLOSURE (Patient Supply) IMPLANT
BLADE 11 BARD-PARKER RIB-BACK SAFETYLOCK (Blade)
BLADE 11 BARD-PARKER RIB-BACK SAFETYLOCK CARBON STEEL SURGICAL (Blade) IMPLANT
BLADE SRG CBNSTL 11 BP RB-BCK SFLOK LF (Blade)
BLADE SRGCLPR W 37.2MM LF NS GP EXIST (Blade)
BLADE SURGICAL CLIPPER WIDE W37.2 MM (Blade)
BLADE SURGICAL CLIPPER WIDE W37.2 MM GENERAL PURPOSE EXIST HANDLE DROP (Blade) IMPLANT
BULB DRAINAGE LIGHTWEIGHT LOW LEVEL (Drain) ×2
BULB DRAINAGE LIGHTWEIGHT LOW LEVEL SUCTION RELIAVAC SILICONE 100 CC (Drain) ×2 IMPLANT
BULB DRN SIL 100CC LF STRL LTWT LO LVL (Drain) ×2
COVER FLEXIBLE LIGHT HANDLE PLASTIC GREEN (Procedure Accessories) ×2 IMPLANT
COVER FLEXIBLE MEDLINE LIGHT HANDLE (Procedure Accessories) ×2
COVER LGHT HNDL PLS LF STRL FLXB DISP (Procedure Accessories) ×2
DISSECTOR KITTNER CTTN GZE 5PK (Instrument) ×2
DISSECTOR LAPAROSCOPIC L.56 IN X W.25 IN (Instrument) ×2
DISSECTOR LAPAROSCOPIC L.56 IN X W.25 IN C5 HOLDER SPONGE XRAY (Instrument) ×2 IMPLANT
DRAIN INCS SIL FULL FLUT RND 15FR 3/16IN (Drain) ×2
DRAIN INCS SIL FULL FLUT RND 19FR .25IN (Drain)
DRAIN OD15 FR RADIOPAQUE 4 FREE FLOW (Drain) ×2
DRAIN OD15 FR RADIOPAQUE 4 FREE FLOW CHANNEL CHANNEL DRAIN L3/16 IN (Drain) ×2 IMPLANT
DRAIN OD19 FR RADIOPAQUE 4 FREE FLOW (Drain)
DRAIN OD19 FR RADIOPAQUE 4 FREE FLOW CHANNEL TROCAR CHANNEL DRAIN L1/4 (Drain) IMPLANT
DRESSING FM SLVR SLF MPLX BR 10X4IN PS (Dressing) ×2
DRESSING GERMICIDAL 1 CHG BIOPATCH (Dressing)
DRESSING GERMICIDAL BIOPATCH (Dressing)
DRESSING GERMICIDAL BIOPATCH CHLORHEXIDINE GLUCONATE DISC OD1 IN (Dressing) IMPLANT
DRESSING GRMCDL CHG DISC BPTCH 1IN 13-20 (Dressing)
DRESSING L10 IN X W4 IN MEPILEX BORDER (Dressing) ×2
DRESSING L10 IN X W4 IN MEPILEX BORDER FOAM SILVER SULPHATE (Dressing) ×2 IMPLANT
DRESSING TRANSPARENT L2 3/4 IN X W2 3/8 (Dressing)
DRESSING TRANSPARENT L2 3/4 IN X W2 3/8 IN POLYURETHANE ADHESIVE (Dressing) IMPLANT
DRESSING TRANSPARENT L4 3/4 IN X W4 IN (Dressing) ×2
DRESSING TRANSPARENT L4 3/4 IN X W4 IN POLYURETHANE ADHESIVE (Dressing) ×2 IMPLANT
DRESSING TRNS PU STD TGDRM 2.75INX2 3/8 (Dressing)
DRESSING TRNS PU STD TGDRM 4.75X4IN LF (Dressing) ×2
ELECTRODE ADULT PATIENT RETURN L9 FT REM POLYHESIVE ACRYLIC FOAM (Procedure Accessories) ×2 IMPLANT
ELECTRODE PATIENT RETURN L9 FT VALLEYLAB (Procedure Accessories) ×2
ELECTRODE PT RTN RM PHSV ACRL 9FT (Procedure Accessories) ×2
GLOVE SRG PLISPRN 7.5 BGL PI ORTHOPRO (Glove) ×2
GLOVE SURGICAL 7 1/2 BIOGEL PI ORTHOPRO (Glove) ×2
GLOVE SURGICAL 7 1/2 BIOGEL PI ORTHOPRO POWDER FREE MICRO ROUGH (Glove) ×2 IMPLANT
KIT INFECTION CONTROL CUSTOM (Kits) ×2
KIT INFECTION CONTROL CUSTOM IFOH03 (Kits) ×2 IMPLANT
MANIFOLD SCT 2 STD NPTN 2 LF NS 4 PORT (Filter) ×2
MANIFOLD SUCTION 2 STANDARD 4 PORT (Filter) ×2
MANIFOLD SUCTION 2 STANDARD 4 PORT NEPTUNE 2 WASTE MANAGEMENT SYSTEM (Filter) ×2 IMPLANT
NEEDLE HPO SS PP RW BD 25GA 1.5IN LF (Needles) IMPLANT
NEEDLE SPINAL BD OD18 GA L3 1/2 IN (Needles)
NEEDLE SPINAL L3 1/2 IN REGULAR WALL QUINCKE TIP OD18 GA BD (Needles) IMPLANT
NEEDLE SPNL PP RW BD QNCK 18GA 3.5IN LF (Needles)
PACK SURGICAL BASIN SET FOAK (Other)
PACK SURGICAL BASIN SET FOAK MEDLINE INDUSTRIES, INC. (Other) IMPLANT
PATCH SRG PP DDRGL SM OVL VENTRIO ST (Mesh) IMPLANT
PATCH SRG SPRMSH SORBAFLEX LG CRC VNTRLX (Mesh) ×2 IMPLANT
PATCH SRGCL LG 3.2IN VENTRALEX SEPRAMESH SORBAFLEX UMBILICL HRN RPR (Mesh) ×2 IMPLANT
PATCH SURGICAL L4.7 IN X W3.1 IN SMALL (Mesh) IMPLANT
PATCH SURGICAL L4.7 IN X W3.1 IN SMALL OVAL SPRING OPEN BIORESORBABLE (Mesh) IMPLANT
PATCH SURGICAL LARGE CIRCLE STRAP OD3.2 (Mesh) ×2 IMPLANT
PIN SAFETY 2IN (Procedure Accessories)
PIN SAFETY L2 IN LARGE GROUPING HOLDING RETAINING (Procedure Accessories) IMPLANT
SEALER/DIVIDER ELECTROSURGICAL L18 CM 14 D 180 D L34 MM CURVE JAW OVAL (Instrument) IMPLANT
SEALER/DIVIDER ESURG 14D 180D 34MM LGSR (Instrument)
SET BASIN SINGLE DISP (Other)
SOLUTION IRR 0.9% NACL 1000ML LF STRL (Irrigation Solutions) ×2
SOLUTION IRRIGATION 0.9% SODIUM CHLORIDE (Irrigation Solutions) ×2
SOLUTION IRRIGATION 0.9% SODIUM CHLORIDE 1000 ML PLASTIC POUR BOTTLE (Irrigation Solutions) ×2 IMPLANT
SPONGE GAUZE L4 IN X W4 IN 12 PLY WOVEN (Dressing)
SPONGE GAUZE L4 IN X W4 IN 12 PLY WOVEN FOLD EDGE XRAY DETECT COTTON (Dressing) IMPLANT
SPONGE GZE CTTN 4X4IN LF STRL 12 PLY WVN (Dressing)
SPONGE LAP CTTN 18X18IN LF STRL 4 PLY (Sponge) ×4
SPONGE LAPAROTOMY L18 IN X W18 IN 4 PLY (Sponge) ×4
SPONGE LAPAROTOMY L18 IN X W18 IN 4 PLY RADIOPAQUE PYRONEMA FREE HIGH (Sponge) ×4 IMPLANT
SUTURE ABS 0 CT1 PDS2 18IN CR MFL 8 STRN (Suture)
SUTURE ABS 1 CT1 PDS2 27IN MFL VIOL (Suture) ×10
SUTURE ABS 1 CTX PDS2 36IN MFL VIOL (Suture) ×4
SUTURE ABS 2-0 CT1 MNCRL 27IN MFL VIOL (Suture)
SUTURE ABS 2-0 SH PDS2 27IN MFL VIOL (Suture)
SUTURE ABS 2-0 SH VCL 27IN BRD COAT UD (Suture) ×4
SUTURE ABS 3-0 PS2 MNCRL MTPS 18IN MFL (Suture) ×4
SUTURE ABS 3-0 SH VCL 18IN CR BRD 8 STRN (Suture)
SUTURE ABS 4-0 PS2 MNCRL MTPS 27IN MFL (Suture) ×4
SUTURE COATED VICRYL 2-0 SH L27 IN BRAID (Suture) ×4
SUTURE COATED VICRYL 2-0 SH L27 IN BRAID COATED UNDYED ABSORBABLE (Suture) ×4 IMPLANT
SUTURE COATED VICRYL 3-0 SH L18 IN (Suture)
SUTURE COATED VICRYL 3-0 SH L18 IN CONTROL BRD 8 STRAND UNDYED ABSRBBL (Suture) IMPLANT
SUTURE MONOCRYL 2-0 CT-1 L27 IN (Suture) IMPLANT
SUTURE MONOCRYL 3-0 PS-2 L18 IN (Suture) ×4
SUTURE MONOCRYL 3-0 PS-2 L18 IN MONOFILAMENT UNDYED ABSORBABLE (Suture) ×4 IMPLANT
SUTURE MONOCRYL 4-0 PS-2 L27 IN (Suture) ×4
SUTURE MONOCRYL 4-0 PS-2 L27 IN MONOFILAMENT UNDYED ABSORBABLE (Suture) ×4 IMPLANT
SUTURE NABSB 0 CT2 PRLN 30IN MFL BLU (Suture) ×2
SUTURE NABSB 2-0 SH PRLN 30IN MFL BLU (Suture)
SUTURE NABSB SLK 2-0 FS PRMHND 18IN BRD (Suture) ×2
SUTURE NABSB SLK 2-0 PRMHND 18IN BRD TIE (Suture)
SUTURE PDS II 0 CT-1 L18 IN CONTROL (Suture)
SUTURE PDS II 0 CT-1 L18 IN CONTROL MNFLMNT 8 STRAND VLT ABSRBBL (Suture) IMPLANT
SUTURE PDS II 1 CT-1 L27 IN MONOFILAMENT (Suture) ×10
SUTURE PDS II 1 CT-1 L27 IN MONOFILAMENT VIOLET ABSORBABLE (Suture) ×10 IMPLANT
SUTURE PDS II 1 CTX L36 IN MONOFILAMENT (Suture) ×4
SUTURE PDS II 1 CTX L36 IN MONOFILAMENT VIOLET ABSORBABLE (Suture) ×4 IMPLANT
SUTURE PDS II 2-0 SH L27 IN MONOFILAMENT (Suture)
SUTURE PDS II 2-0 SH L27 IN MONOFILAMENT VIOLET ABSORBABLE (Suture) IMPLANT
SUTURE PROLENE BLUE 0 CT-2 L30 IN (Suture) ×2
SUTURE PROLENE BLUE 0 CT-2 L30 IN MONOFILAMENT NONABSORBABLE (Suture) ×2 IMPLANT
SUTURE PROLENE BLUE 2-0 SH L30 IN (Suture)
SUTURE PROLENE BLUE 2-0 SH L30 IN MONOFILAMENT NONABSORBABLE (Suture) IMPLANT
SUTURE SILK PERMA HAND BLACK 2-0 FS L18 (Suture) ×2
SUTURE SILK PERMA HAND BLACK 2-0 FS L18 IN BRAID NONABSORBABLE (Suture) ×2 IMPLANT
SUTURE SILK PERMA HAND BLACK 2-0 L18 IN (Suture)
SUTURE SILK PERMA HAND BLACK 2-0 L18 IN BRAID TIES 12 STRAND PRECUT (Suture) IMPLANT
SYRINGE 10 ML GRADUATE NONPYROGENIC DEHP (Syringes, Needles)
SYRINGE 10 ML GRADUATE NONPYROGENIC DEHP FREE PVC FREE LOK MEDICAL (Syringes, Needles) IMPLANT
SYRINGE 30 ML CONCENTRIC TIP GRADUATE (Syringes, Needles)
SYRINGE 30 ML CONCENTRIC TIP GRADUATE NONPYROGENIC DEHP FREE LOK (Syringes, Needles) IMPLANT
SYRINGE MED 10ML LL LF STRL GRAD N-PYRG (Syringes, Needles)
SYRINGE MED 30ML LL LF STRL CONC TIP (Syringes, Needles)
TOWEL L27 IN X W17 IN COTTON PREWASH (Other)
TOWEL L27 IN X W17 IN COTTON PREWASH DELINT HIGH ABSORBENT BLUE (Other) IMPLANT
TOWEL OR CTTN 26X17IN LF STRL RADOPQ (Procedure Accessories)
TOWEL OR L26 IN X W17 IN RADIOPAQUE (Procedure Accessories)
TOWEL OR L26 IN X W17 IN RADIOPAQUE PREWASH DELINT COTTON GREEN (Procedure Accessories) IMPLANT
TOWEL SRG CTTN 27X17IN LF STRL PREWASH (Other)
TRAY CATH PVP SIL 10ML 2000ML 14FR LTX 1 (Tray) ×2
TRAY CATH PVP SIL 16FR LTX 1 LYR FL STRG (Tray)
TRAY CATHETER 1 LAYER FOLEY DRAIN BAG PERI WIPE PVP SILICON ADULT OD14 (Tray) ×2 IMPLANT
TRAY CATHETER MEDLINE 1 LAYER FOLEY (Tray) ×2
TRAY CATHETERIZATION OD16 FR 1 LAYER (Tray) IMPLANT
TRAY SRG MAJOR PROC IFOH (Pack) ×2
TRAY SURGICAL MAJOR (Pack) ×2 IMPLANT

## 2022-05-04 NOTE — Anesthesia Postprocedure Evaluation (Addendum)
Anesthesia Post Evaluation    Patient: Tanya Walters    Procedure(s):  REPAIR LEFT FLANK HERNIA  MESH EXCISION (CPT [phone])    Anesthesia type: general    Last Vitals:   Vitals Value Taken Time   BP 152/77 05/04/22 1040   Temp 37.3 C (99.1 F) 05/04/22 1030   Pulse 92 05/04/22 1050   Resp 12 05/04/22 1050   SpO2 94 % 05/04/22 1050                 Anesthesia Post Evaluation:     Patient Evaluated: bedside  Patient Participation: complete - patient participated  Level of Consciousness: awake and alert    Pain Management: adequate  Multimodal analgesia pain management approach  Strategies: steroids and acetaminophen  Airway Patency: patent  Two or more mitigation strategies used for obstructive sleep apnea.  Strategies: verification of full NMB reversal, extubation/recovery carried out in nonsupine position and multimodal analgesia    Anesthetic complications: No      PONV Status: none    Cardiovascular status: acceptable  Respiratory status: acceptable  Hydration status: acceptable          Signed by: Eino Farber, MD, 05/04/2022 11:16 AM

## 2022-05-04 NOTE — Transfer of Care (Signed)
Anesthesia Transfer of Care Note    Patient: Tanya Walters    Procedures performed: Procedure(s):  REPAIR LEFT FLANK HERNIA  MESH EXCISION (CPT [phone])    Anesthesia type: General ETT    Patient location:Phase I PACU    Last vitals: There were no vitals filed for this visit.    Post pain: Patient not complaining of pain, continue current therapy      Mental Status:awake and alert     Respiratory Function: tolerating face mask    Cardiovascular: stable    Nausea/Vomiting: patient not complaining of nausea or vomiting    Hydration Status: adequate    Post assessment: no apparent anesthetic complications, no reportable events, and no evidence of recall    Signed by: Napoleon Form, CRNA  05/04/22 9:47 AM

## 2022-05-04 NOTE — Plan of Care (Signed)
Admission Note-  Patient admitted to Surgical unit at 1144. Hand off report received from PACU   Patient oriented to room, bed in lowest position, bed alarm activated, call bell within reach.   Fall prevention protocols reviewed with patient and visitor.  Patient informed of visitor policy.  Belongings including clothing and bilateral hearing aids at bedside.     Brief Clinical Picture:  Neuro AXO4; Resp - 96% on 2L nasal cannula; JP drain to left flank intact  Skin Assessment    4 eyes in 4 hours pressure injury assessment note:      Completed with: Ravneet, RN  Unit & Time admitted: Surgical, RN              Bony Prominences: Check appropriate box; if wound is present enter wound assessment in LDA     Occiput:                 [x] WNL  []  Wound present  Face:                     [x] WNL  []  Wound present  Ears:                      [x] WNL  []  Wound present  Spine:                    [x] WNL  []  Wound present  Shoulders:             [x] WNL  []  Wound present  Elbows:                  [x] WNL  []  Wound present  Sacrum/coccyx:     [x] WNL  []  Wound present  Ischial Tuberosity:  [x] WNL  []  Wound present  Trochanter/Hip:      [x] WNL  []  Wound present  Knees:                   [x] WNL  []  Wound present  Ankles:                   [x] WNL  []  Wound present  Heels:                    [x] WNL  []  Wound present              Braden score <15. No      Problem: Pain interferes with ability to perform ADL  Goal: Pain at adequate level as identified by patient  Outcome: Progressing  Flowsheets (Taken 05/04/2022 1323)  Pain at adequate level as identified by patient:   Identify patient comfort function goal   Assess for risk of opioid induced respiratory depression, including snoring/sleep apnea. Alert healthcare team of risk factors identified.   Assess pain on admission, during daily assessment and/or before any "as needed" intervention(s)   Evaluate patient's satisfaction with pain management progress   Reassess pain within 30-60 minutes  of any procedure/intervention, per Pain Assessment, Intervention, Reassessment (AIR) Cycle   Evaluate if patient comfort function goal is met   Offer non-pharmacological pain management interventions   Include patient/patient care companion in decisions related to pain management as needed     Problem: Side Effects from Pain Analgesia  Goal: Patient will experience minimal side effects of analgesic therapy  Outcome: Progressing  Flowsheets (Taken 05/04/2022 1323)  Patient will experience minimal side effects of analgesic therapy:   Monitor/assess patient's respiratory status (  RR depth, effort, breath sounds)   Assess for changes in cognitive function   Prevent/manage side effects per LIP orders (i.e. nausea, vomiting, pruritus, constipation, urinary retention, etc.)   Evaluate for opioid-induced sedation with appropriate assessment tool (i.e. POSS)     Problem: Moderate/High Fall Risk Score >5  Goal: Patient will remain free of falls  Outcome: Progressing  Flowsheets (Taken 05/04/2022 1200)  High (Greater than 13):   HIGH-Bed alarm on at all times while patient in bed   HIGH-Utilize chair pad alarm for patient while in the chair   MOD-Perform dangle, stand, walk (DSW) prior to mobilization   MOD-Use of assistive devices -Bedside Commode if appropriate   MOD-Remain with patient during toileting   MOD-Place Fall Risk level on whiteboard in room     Problem: Safety  Goal: Patient will be free from injury during hospitalization  Outcome: Progressing  Flowsheets (Taken 05/04/2022 1323)  Patient will be free from injury during hospitalization:   Assess patient's risk for falls and implement fall prevention plan of care per policy   Provide and maintain safe environment   Ensure appropriate safety devices are available at the bedside   Use appropriate transfer methods   Include patient/ family/ care giver in decisions related to safety   Hourly rounding   Provide alternative method of communication if needed (communication  boards, writing)  Goal: Patient will be free from infection during hospitalization  Outcome: Progressing  Flowsheets (Taken 05/04/2022 1323)  Free from Infection during hospitalization:   Assess and monitor for signs and symptoms of infection   Monitor lab/diagnostic results   Encourage patient and family to use good hand hygiene technique   Monitor all insertion sites (i.e. indwelling lines, tubes, urinary catheters, and drains)     Problem: Pain  Goal: Pain at adequate level as identified by patient  Outcome: Progressing  Flowsheets (Taken 05/04/2022 1323)  Pain at adequate level as identified by patient:   Identify patient comfort function goal   Assess for risk of opioid induced respiratory depression, including snoring/sleep apnea. Alert healthcare team of risk factors identified.   Assess pain on admission, during daily assessment and/or before any "as needed" intervention(s)   Evaluate patient's satisfaction with pain management progress   Reassess pain within 30-60 minutes of any procedure/intervention, per Pain Assessment, Intervention, Reassessment (AIR) Cycle   Evaluate if patient comfort function goal is met   Offer non-pharmacological pain management interventions   Include patient/patient care companion in decisions related to pain management as needed

## 2022-05-04 NOTE — Op Note (Signed)
FULL OPERATIVE NOTE    Date Time: 05/04/22 9:23 AM  Patient Name: Tanya Walters, Tanya Walters (MRN: [id number])  Attending Physician: Barnet Glasgow, MD      Date of Operation:   05/04/2022    Providers Performing:   Surgeon(s) and Role:     * Barnet Glasgow, MD - Primary    Surgical First Assistant(s):   First Assistant: Larence Penning    Operative Procedure:   REPAIR LEFT FLANK HERNIA: 24268 (CPT)  MESH EXCISION (CPT [phone]): 20680 (CPT)    Preoperative Diagnosis:   Pre-Op Diagnosis Codes:     * Incisional hernia, without obstruction or gangrene [K43.2]    Postoperative Diagnosis:   Post-Op Diagnosis Codes:     * Incisional hernia, without obstruction or gangrene [K43.2]    Anesthesia:   General    Findings:     Recurrent left flank hernia repaired with retroperitoneal 8cm Ventralex ST; smaller adjacent hernia repaired with PDS    Indications:     Surgery attending - H&P     She is status post open repair of left flank hernia on October 05, 2021 in [place] complicated by immediate recurrence and pain.  Recent CT imaging demonstrates a roughly 9 cm left posterior wall defect containing a bowel     She reports some constipation, in addition to left inguinal pain.  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 ,  Acceptable  - Hx Diabetes: no   - History of smoking: No    - History of chronic  steroids: No    - History of radiation treatment: No       Current mesh configuration: Unknown     Imaging review: 9 centimeter flank hernia defect     The patient was assessed in preop and determined to be an acceptable candidate for surgery. Since last clinic visit, the patient denies new hospitalizations, medications, allergies, or procedures.    Operative Notes:     The risk and benefits of surgery were discussed in detail and informed consent was obtained.  The patient was taken the operating room placed in the supine position.  A Foley catheter was inserted, general endotracheal anesthesia induced, SCDs applied, and perioperative antibiotics administered.  The patient was then positioned in the right lateral decubitus position with a beanbag and axillary roll.  The patient was prepped and draped in usual sterile fashion.  A timeout confirmed correct patient, procedure, left side, and indication.    We began with a left flank incision over the existing scar between the costal margin and iliac crest, and deepened this with electrocautery.  We  encountered a seroma cavity that had formed around pre-existing ePTFE mesh.  This was opened and drained, and all existing mesh excised in entirety and sent to pathology as specimen.  We then examined the musculature in an obvious roughly 5 cm diameter recurrent flank hernia defect was noted with reducible retroperitoneal fat.  A much smaller defect roughly 1 cm immediately adjacent to this and the muscle was sutured with #1 PDS.  A large Ventralex ST mesh was placed behind the defect in the retroperitoneal space, and the fascia closed over this in a running fashion using 0 Prolene suture incorporating the tails of the mesh into the repair.  The abdominal wall was without any palpable defects following this repair.  A 15 [place] Blake drain was placed and sutured with silk.  The wound was then closed in layers using #1 PDS in interrupted fashion, 3 Monocryl and 4-0 Monocryl  before covered with Dermabond and dressings.    Instrument count was correct.  The procedure was tolerated well without obvious complications.  The patient was awakened from anesthesia taken to PACU in stable condition.  I was present for the entire procedure.                  Estimated Blood Loss:     Minimal    Implants:     Implant Name Type Inv. Item Serial No. Model No. Manufacturer Lot No. LRB No. Used Action   PATCH SRG [place] SORBAFLEX LG CRC VNTRLX - [id number] Mesh PATCH SRG [place] SORBAFLEX LG [place] VNTRLX  [id number] BARD DAVOL [id number] Left 1 Implanted          Drains:   Drains: Yes, Drain #1: Blake Round 15 Fr      Specimens:     ID Type Source Tests Collected by Time Destination   A : Mesh excision Other (specify site in comments) Foreign body (specify site/description) NO PATHOLOGY Barnet Glasgow, MD 05/04/2022 9563          Complications:   None    Signed by: Barnet Glasgow, MD  [facility]

## 2022-05-04 NOTE — Progress Notes (Signed)
NURSE NOTE SUMMARY  [facility]   Patient Name: Tanya Walters, Tanya Walters   Attending Physician: Barnet Glasgow, MD   Today's date:   05/04/2022 LOS: 0 days   Shift Summary:                                                              A+0X4  Vitals: WNL  Pain: Pain is manageable on current regimen. Being managed with prn oxycodone  Dressing: clean, dry & intact  Incision site: left flank, silver mepilex intackt  Ambulating: yes (Steady Gait, walker)  Voiding: yes Clear Yellow urine  Passing Gas: no  BM: no   Drain: JP drain to abdomen    Net I&O for admission: Net IO Since Admission: -250 mL [05/04/22 1839]  Net I&O for past 24 hours:   Intake/Output Summary (Last 24 hours) at 05/04/2022 1839  Last data filed at 05/04/2022 1800  Gross per 24 hour   Intake 1300 ml   Output 1550 ml   Net -250 ml       GI:  Patient able to tolerate regular diet. Patient had Some nausea, patient states shortly after taking prn oxycodone.           Provider Notifications:      Rapid Response Notifications:  Mobility:      PMP Activity: Step 6 - Walks in Room (05/04/2022 12:00 PM)     Weight tracking:  Family Dynamic:   Last 3 Weights for the past 72 hrs (Last 3 readings):   Weight   05/04/22 1327 79.4 kg (175 lb)             Recent Vitals Last Bowel Movement   BP: 126/84 (05/04/2022  4:01 PM)  Heart Rate: 100 (05/04/2022  4:01 PM)  Temp: 98.2 F (36.8 C) (05/04/2022  4:01 PM)  Resp Rate: 14 (05/04/2022 11:44 AM)  Height: 1.575 m (5' 2.01") (05/04/2022  1:27 PM)  Weight: 79.4 kg (175 lb) (05/04/2022  1:27 PM)  SpO2: 92 % (05/04/2022  4:01 PM)   Last BM Date: 05/03/22

## 2022-05-04 NOTE — Progress Notes (Signed)
General Surgery            Postop Note      Procedure:  Repair recurrent left flank hernia    Indication:      She is status post open repair of left flank hernia on October 05, 2021 in [place] complicated by immediate recurrence and pain.  Recent CT imaging demonstrates a roughly 9 cm left posterior wall defect containing a bowel     She reports some constipation, in addition to left inguinal pain.  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 Sydell Axon, MD, [facility]

## 2022-05-04 NOTE — H&P (Signed)
General Surgery        Surgery attending - H&P    She is status post open repair of left flank hernia on October 05, 2021 in [place] complicated by immediate recurrence and pain.  Recent CT imaging demonstrates a roughly 9 cm left posterior wall defect containing a bowel     She reports some constipation, in addition to left inguinal pain.  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 ,  Acceptable  - Hx Diabetes: no   - History of smoking: No    - History of chronic steroids: No    - History of radiation treatment: No       Current mesh configuration: Unknown     Imaging review: 9 centimeter flank hernia defect    The patient was assessed in preop and determined to be an acceptable candidate for surgery. Since last clinic visit, the patient denies new hospitalizations, medications, allergies, or procedures.     Past Medical History:   Diagnosis Date    Arthritis     Chronic obstructive pulmonary disease     possible dx years ago; recent pulmonary workup was negative per daughter    COVID-19 vaccine administered     3x moderna    COVID-19 vaccine series started     x2? unknown brand    Deep venous thrombosis of distal lower extremity     years ago, unprovoked, pt on Eliquis    Dementia     early onset, alert to person, place and time, signs own consent    Depression     Dyspnea on exertion  11/2021    per daughter, pt no longer having sob    Fall 03/29/2022    bending over to pick something up and lost balance, broke nose    Gastroesophageal reflux disease     Incisional hernia without obstruction or gangrene     preop dx    Nasal fracture 03/29/2022    from fall    Post-operative nausea and vomiting     mild    Seasonal allergic rhinitis     Sleep apnea     uses cpap intermittently         Past Surgical History:   Procedure Laterality Date    APPENDECTOMY (OPEN)  as a child    bladder stimulator implant      COLONOSCOPY      FOOT SURGERY Bilateral     HERNIA REPAIR  10/05/2021    HYSTERECTOMY  mid 1990's    SPINE SURGERY  2019    fusion         There were no  vitals taken for this visit.      Focused exam:    Gen: Appears well  Neuro: AAOx3  Resp: Unlabored respirations, no respiratory distress  CV: RRR  Abd: incarcerated recurrent left flank hernia       The risks and benefits of surgery were discussed in detail with the patient at bedside who consents to operative intervention today - LEFT flank hernia repair       Tanya Walters. Sydell Axon, MD, [facility]

## 2022-05-05 ENCOUNTER — Encounter: Payer: Self-pay | Admitting: Surgery

## 2022-05-05 DIAGNOSIS — Z9889 Other specified postprocedural states: Secondary | ICD-10-CM

## 2022-05-05 DIAGNOSIS — Z8719 Personal history of other diseases of the digestive system: Secondary | ICD-10-CM

## 2022-05-05 DIAGNOSIS — K469 Unspecified abdominal hernia without obstruction or gangrene: Secondary | ICD-10-CM

## 2022-05-05 MED ORDER — CYCLOBENZAPRINE HCL 10 MG PO TABS
10.0000 mg | ORAL_TABLET | Freq: Three times a day (TID) | ORAL | 0 refills | Status: AC | PRN
Start: 2022-05-05 — End: ?

## 2022-05-05 MED ORDER — DOCUSATE SODIUM 100 MG PO CAPS
100.0000 mg | ORAL_CAPSULE | Freq: Two times a day (BID) | ORAL | 0 refills | Status: AC
Start: 2022-05-05 — End: ?

## 2022-05-05 MED ORDER — PANTOPRAZOLE SODIUM 40 MG PO TBEC
40.0000 mg | DELAYED_RELEASE_TABLET | Freq: Every morning | ORAL | Status: DC
Start: 2022-05-05 — End: 2022-05-06
  Administered 2022-05-05 – 2022-05-06 (×2): 40 mg via ORAL
  Filled 2022-05-05 (×2): qty 1

## 2022-05-05 MED ORDER — OXYCODONE HCL 5 MG PO TABS
5.0000 mg | ORAL_TABLET | ORAL | 0 refills | Status: AC | PRN
Start: 2022-05-05 — End: ?

## 2022-05-05 NOTE — Progress Notes (Addendum)
Initial Case Management Assessment and Discharge [facility] [facility]   Patient Name: Tanya Walters, Tanya Walters   Date of Birth 1946-01-24   Attending Physician: Barnet Glasgow, MD   Primary Care Physician: Junius Roads, MD   Length of Stay 1   Reason for Consult / Chief Complaint IDPA/ SP Left flank Hernia repair w mesh excision performed on 2/1.24        Situation   Admission DX:   1. Incisional hernia, without obstruction or gangrene        A/O Status: X 3    LACE Score: 1    Patient admitted from: direct  Admission Status: inpatient    [place]: Self- no docs         Background     Advanced directive:   <no information>      Code Status:   Full Code     Residence: One story home/apartment    PCP: PCP Not on File, MD  Patient Contact:   [phone] (home)     [phone] (mobile)     Emergency contact:   Extended Emergency Contact Information  Primary Emergency Contact: Comes,Sharon  Address: [address]           [place], [place] [address] [place] Phone: [phone]  Relation: Daughter in law  Preferred language: English  Interpreter needed? 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 - [place] - [address]  [address]  [place] [address]  Phone: [phone] Fax: [phone]    [id number] - [address], [place]  [place] [address]  Phone: [phone] Fax: [phone]      Prescription Coverage: Yes    Home Health: The patient is not currently receiving home health services.    Previous SNF/AR: N/A    Date First IMM given: 05/04/22  UAI on file?: No  Transport for discharge? Mode of transportation: Sports coach - Family/Friend to drive patient  Agreeable to Home with  family post-discharge:  Yes     Assessment   Met with patient verified all as above. Patient lives alone in a one story home w a basement. There are no STE.  When she is [place] ready depending how strong she is feeling she will either return home or [place] to her dtrs or dtr- in-laws home.  Her home DME is as listed above.  One of patients dtrs will also be her ride home.  Patient does have a PCP in [place]- Dr. Renato Battles of [facility].  Patient denies issues obtaining/affording medication, food, transportation, housing, and finances.There were no other CM needs noted at the time of my assessment, the CM team will continue to follow peripherally as needed.  BARRIERS TO DISCHARGE: Pending surgically cleared     Recommendation   D/C Plan A: Home with family    D/C Plan B: Home with family    D/C Plan C: Home with family         Donell Sievert RN, BSN  RN Case Manager I  Case Management Dept  [phone]

## 2022-05-05 NOTE — Progress Notes (Signed)
No apparent complications following anesthesia.  Patient currently resting and information obtained from family member.

## 2022-05-05 NOTE — Progress Notes (Addendum)
General Surgery       [facility]   [phone]        Date Time: 05/05/22 10:46 AM  Patient Name: Tanya Walters, Tanya Walters [place]  Surgery attending: Lianne Moris, MD, FACS      ASSESSMENT:     1 Day Post-Op S/P Procedure(s):  REPAIR LEFT FLANK HERNIA  MESH EXCISION (CPT [phone])    PLAN:     Healing well POD1 s/p recurrent L flank hernia repair    Regular diet, pain control, SQH DVT ppx    Holding home Eliquis 48h postop, resume tomorrow     She lives several hours away and would benefit from another day as inpatient for further recovery     Anticipate [place] home tomorrow with drain in place with clinic f/u next Friday for drain removal     [place] navigator updated, [place] meds sent to [facility]:     The patient is doing well.  Post operative pain is well controlled with medications.      Current dietary status:  Diet regular and tolerating well.        Additional complaints: none       OBJECTIVE:     Current Vitals:   Vitals:    05/05/22 0956   BP: 131/83   Pulse: 77   Resp:    Temp: 98.2 F (36.8 C)   SpO2: 91%       Intake and Output Summary (Last 24 hours):  I/O last 3 completed shifts:  In: 2220 [P.O.:420; I.V.:1700; IV Piggyback:100]  Out: 2040 [Urine:1850; Drains:190]    Labs:     Results       ** No results found for the last 24 hours. **            Rads:     Radiology Results (24 Hour)       ** No results found for the last 24 hours. **            Physical Exam:     Focused physical exam    Mental status - alert, oriented to person, place, and time, normal mood, behavior, speech, dress, motor activity, and thought processes  Abdomen - soft, nontender, nondistended, no masses or organomegaly  Wound - healing      Memory Argue. Sydell Axon, MD, FACS  [facility]

## 2022-05-05 NOTE — Plan of Care (Signed)
Problem: Pain interferes with ability to perform ADL  Goal: Pain at adequate level as identified by patient  Outcome: Progressing  Flowsheets (Taken 05/05/2022 0213 by Sampson Goon, RN)  Pain at adequate level as identified by patient:   Identify patient comfort function goal   Assess for risk of opioid induced respiratory depression, including snoring/sleep apnea. Alert healthcare team of risk factors identified.   Assess pain on admission, during daily assessment and/or before any "as needed" intervention(s)   Evaluate if patient comfort function goal is met   Reassess pain within 30-60 minutes of any procedure/intervention, per Pain Assessment, Intervention, Reassessment (AIR) Cycle   Offer non-pharmacological pain management interventions   Evaluate patient's satisfaction with pain management progress     Problem: Side Effects from Pain Analgesia  Goal: Patient will experience minimal side effects of analgesic therapy  Outcome: Progressing  Flowsheets (Taken 05/05/2022 0213 by Sampson Goon, RN)  Patient will experience minimal side effects of analgesic therapy:   Monitor/assess patient's respiratory status (RR depth, effort, breath sounds)   Assess for changes in cognitive function   Evaluate for opioid-induced sedation with appropriate assessment tool (i.e. POSS)     Problem: Moderate/High Fall Risk Score >5  Goal: Patient will remain free of falls  Outcome: Progressing  Flowsheets (Taken 05/05/2022 0820)  High (Greater than 13):   HIGH-Bed alarm on at all times while patient in bed   HIGH-Utilize chair pad alarm for patient while in the chair   MOD-Perform dangle, stand, walk (DSW) prior to mobilization   MOD-Remain with patient during toileting   MOD-Place Fall Risk level on whiteboard in room     Problem: Safety  Goal: Patient will be free from injury during hospitalization  Outcome: Progressing  Flowsheets (Taken 05/05/2022 0213 by Sampson Goon, RN)  Patient will be free from injury  during hospitalization:   Assess patient's risk for falls and implement fall prevention plan of care per policy   Use appropriate transfer methods   Provide and maintain safe environment   Ensure appropriate safety devices are available at the bedside   Include patient/ family/ care giver in decisions related to safety   Assess for patients risk for elopement and implement Elopement Risk Plan per policy   Hourly rounding   Provide alternative method of communication if needed (communication boards, writing)     Problem: Pain  Goal: Pain at adequate level as identified by patient  Outcome: Progressing  Flowsheets (Taken 05/05/2022 0213 by Sampson Goon, RN)  Pain at adequate level as identified by patient:   Identify patient comfort function goal   Assess for risk of opioid induced respiratory depression, including snoring/sleep apnea. Alert healthcare team of risk factors identified.   Assess pain on admission, during daily assessment and/or before any "as needed" intervention(s)   Evaluate if patient comfort function goal is met   Reassess pain within 30-60 minutes of any procedure/intervention, per Pain Assessment, Intervention, Reassessment (AIR) Cycle   Offer non-pharmacological pain management interventions   Evaluate patient's satisfaction with pain management progress

## 2022-05-05 NOTE — UM Notes (Signed)
** This review is compiled from documentation provided by the treatment team within the patient's medical record. **     Velna Hatchet, RN, BSN, ACM  Clinical Case Manager - Utilization Review    [facility]  [address], [address]  [place], [place], [address]    Fax Number: [place]: [phone]  Confidential VM: [phone]  [email or web address]      Please use fax number or insurance line to provide authorization for hospital services or to request additional information.       [facility]   ([place]) [facility]   ([place]) [facility]   [facility]) [facility]   [facility]) [facility]  [facility])   [place].  [place], [place] [address] [place]  [place], [place] [address] [address]  [place], [place] [address] [phone] [facility].  [place], [place] [address] [place]  [place], [place] [address]   NPI: [id number]  Tax ID: [id number] NPI: [id number]  Tax ID: [id number] NPI: [id number]  Tax ID: [id number] NPI: [id number]  Tax ID: [id number] NPI: [id number]  Tax ID: [id number]       Clinical Review  Date of Service: 2/1-2/2    Patient Name: Tanya Walters  DOB: 1946/01/06      [id number]      Elective Admission  77 y.o. female presents for procedure      Status Order:  Admit to Inpatient (Order #[id number]) on 05/04/22         For OR:    General Information    Date: 05/04/2022 Time: 0730 Status: Posted   Location: [facility] MAIN OR Room: RM 06 Service: General   Patient class: Surgery Admit (IP) Case classification: Elective      Procedure Not Performed    No data filed  Panel Information    Panel 1    Provider Role   Barnet Glasgow, MD Primary    Procedure Laterality Anesthesia   REPAIR LEFT FLANK HERNIA [70263 (CPT)] Left General   MESH EXCISION (CPT [phone]) [phone] (CPT)] N/A         Additional CPT Codes As Scheduled    CPT(R) CPT(R) Name   49616 PR RPR [place] 3-10  CM NCRC8/STRANGULATED   C1781 PR MESH (IMPLANTABLE)       To surgery floor post procedure        Day of surgery progress note:    Procedure:  Repair recurrent left flank hernia     Indication:       She is status post open repair of left flank hernia on October 05, 2021 in [place] complicated by immediate recurrence and pain.  Recent CT imaging demonstrates a roughly 9 cm left posterior wall defect containing a bowel     She reports some constipation, in addition to left inguinal pain.  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 - held 3 days preop         Findings:             Recurrent left flank hernia repaired with retroperitoneal 8cm Ventralex ST; smaller adjacent hernia repaired with PDS      Plan:      -Regular diet, low IVF  -Pain control  -Drain care  -SQH DVT ppx, holding home Eliquis 48h postop  -Anticipate  [name] home POD 1-2          ------Clinical Update 2/2-----        Per MD progress note:    ASSESSMENT:      1 Day Post-Op S/P Procedure(s):  REPAIR LEFT FLANK HERNIA  MESH EXCISION (CPT [phone])     PLAN:      Healing well POD1 s/p recurrent L flank hernia repair     Regular diet, pain control, SQH DVT ppx     Holding home Eliquis 48h postop, resume tomorrow      She lives several hours away and would benefit from another day as inpatient for further recovery      Anticipate [name] home tomorrow with drain in place with clinic f/u next Friday for drain removal      [place] navigator updated, [place] meds sent to [place]:    05/05/22 0614 05/05/22 0956 05/05/22 1104 05/05/22 1111   BP: 155/90 131/83 121/75 106/67   Pulse: 75 77 93 93   Resp: 16  14    Temp: 98.2 F (36.8 C) 98.2 F (36.8 C) 97.7 F (36.5 C) 98.2 F (36.8 C)   TempSrc: Oral Oral Oral Oral   SpO2: 98% 91% 98% 99%   Weight:       Height:         Scheduled Meds:  Current Facility-Administered Medications   Medication Dose Route Frequency     acetaminophen  650 mg Oral 4 times per day    heparin (porcine)  5,000 Units Subcutaneous Q12H [facility]    pantoprazole  40 mg Oral QAM AC     Continuous Infusions:   lactated ringers Stopped (05/04/22 1900)    lactated ringers 50 mL/hr at 05/05/22 0629     PRN Meds:.  ondansetron (ZOFRAN) injection 4 mg  Dose: 4 mg  Freq: Every 6 hours PRN Route: IV  PRN Reasons: Nausea,Vomiting  Start: 05/04/22 0931   Given x2    oxyCODONE (ROXICODONE) immediate release tablet 5 mg  Dose: 5 mg  Freq: Every 4 hours PRN Route: PO  PRN Reason: severe pain  Start: 05/04/22 0932   Given x3

## 2022-05-05 NOTE — Plan of Care (Addendum)
NURSE NOTE SUMMARY  [place]   Patient Name: Tanya Walters, Tanya Walters   Attending Physician: Barnet Glasgow, MD   Today's date:   05/05/2022 LOS: 1 days   Shift Summary:                                                                A+0X4  Vitals: WNL  Pain: Pain is manageable on current regimen Managed with PRN oxycodone  Dressing: clean, dry & intact  Incision site: left flank, covered with silver dressing  Ambulating: yes (gait belt, walker)  Voiding: yes Clear Yellow urine  Passing Gas: no  BM: no   Drain: JP drain  Foley: N/A    GI:  Patient able to tolerate regular diet. Patient had No nausea. Encouraged I.S. 10xhr. Will continue to assess patient. C/o feeling nausea with oxycodone, medicated with PRN zofran.           Provider Notifications:      Rapid Response Notifications:  Mobility:      PMP Activity: Step 7 - Walks out of Room (05/04/2022  9:37 PM)     Weight tracking:  Family Dynamic:   Last 3 Weights for the past 72 hrs (Last 3 readings):   Weight   05/04/22 1327 79.4 kg (175 lb)             Recent Vitals Last Bowel Movement   BP: 155/90 (05/05/2022  6:14 AM)  Heart Rate: 75 (05/05/2022  6:14 AM)  Temp: 98.2 F (36.8 C) (05/05/2022  6:14 AM)  Resp Rate: 16 (05/05/2022  6:14 AM)  Height: 1.575 m (5' 2.01") (05/04/2022  1:27 PM)  Weight: 79.4 kg (175 lb) (05/04/2022  1:27 PM)  SpO2: 98 % (05/05/2022  6:14 AM)   Last BM Date: 05/03/22         Problem: Pain interferes with ability to perform ADL  Goal: Pain at adequate level as identified by patient  Outcome: Progressing  Flowsheets (Taken 05/05/2022 0213)  Pain at adequate level as identified by patient:   Identify patient comfort function goal   Assess for risk of opioid induced respiratory depression, including snoring/sleep apnea. Alert healthcare team of risk factors identified.   Assess pain on admission, during daily assessment and/or before any "as needed" intervention(s)   Evaluate if patient comfort function goal is met   Reassess  pain within 30-60 minutes of any procedure/intervention, per Pain Assessment, Intervention, Reassessment (AIR) Cycle   Offer non-pharmacological pain management interventions   Evaluate patient's satisfaction with pain management progress     Problem: Side Effects from Pain Analgesia  Goal: Patient will experience minimal side effects of analgesic therapy  Outcome: Progressing  Flowsheets (Taken 05/05/2022 0213)  Patient will experience minimal side effects of analgesic therapy:   Monitor/assess patient's respiratory status (RR depth, effort, breath sounds)   Assess for changes in cognitive function   Evaluate for opioid-induced sedation with appropriate assessment tool (i.e. POSS)     Problem: Moderate/High Fall Risk Score >5  Goal: Patient will remain free of falls  Outcome: Progressing  Flowsheets (Taken 05/04/2022 2000)  High (Greater than 13):   HIGH-Bed alarm on at all times while patient in bed   MOD-Remain with patient during toileting  MOD-Perform dangle, stand, walk (DSW) prior to mobilization     Problem: Safety  Goal: Patient will be free from injury during hospitalization  Outcome: Progressing  Flowsheets (Taken 05/05/2022 0213)  Patient will be free from injury during hospitalization:   Assess patient's risk for falls and implement fall prevention plan of care per policy   Use appropriate transfer methods   Provide and maintain safe environment   Ensure appropriate safety devices are available at the bedside   Include patient/ family/ care giver in decisions related to safety   Assess for patients risk for elopement and implement Elopement Risk Plan per policy   Hourly rounding   Provide alternative method of communication if needed (communication boards, writing)  Goal: Patient will be free from infection during hospitalization  Outcome: Progressing  Flowsheets (Taken 05/05/2022 0213)  Free from Infection during hospitalization:   Assess and monitor for signs and symptoms of infection   Monitor  lab/diagnostic results   Encourage patient and family to use good hand hygiene technique   Monitor all insertion sites (i.e. indwelling lines, tubes, urinary catheters, and drains)     Problem: Pain  Goal: Pain at adequate level as identified by patient  Outcome: Progressing  Flowsheets (Taken 05/05/2022 0213)  Pain at adequate level as identified by patient:   Identify patient comfort function goal   Assess for risk of opioid induced respiratory depression, including snoring/sleep apnea. Alert healthcare team of risk factors identified.   Assess pain on admission, during daily assessment and/or before any "as needed" intervention(s)   Evaluate if patient comfort function goal is met   Reassess pain within 30-60 minutes of any procedure/intervention, per Pain Assessment, Intervention, Reassessment (AIR) Cycle   Offer non-pharmacological pain management interventions   Evaluate patient's satisfaction with pain management progress

## 2022-05-05 NOTE — Discharge Instr - AVS First Page (Addendum)
AFTER YOUR VENTRAL HERNIA SURGERY    ACTIVITY:  There are no restrictions on daily activities, including going up and down stairs.  We encourage you to walk frequently, and there are no restrictions on the distances you may walk.  A good rule of thumb is: If it hurts, don't do it!  It is recommended that you avoid extremely strenuous physical activity for four weeks.  Larger hernias may require more extensive restrictions.  Specific exercise regimens will be discussed on your first post-operative visit.  Once you have stopped taking prescription medication and are walking comfortably, you may drive again.     CARE FOR THE INCISION:  In 24 hours, you may remove the bandage and shower.  No soaking for two weeks, including baths, pools, the ocean, or hot tubs.  Gently pat the incisions dry after showering.  There may be staples or tapes (steri-strips) across the incision, which are to be left in place.  Re-apply a light gauze dressing if you wish.  Use an ice pack over the incisional area intermittently in periods of 15 - 20 minutes for the first 24 hours after surgery.  An abdominal binder may have been provided for your comfort.  Use this as needed while you are awake.      DIET:  For the first 24 hours after surgery, you may not have much of an appetite or feel able to tolerate heavy foods.  We encourage you to keep up with your liquids.  As your appetite increases, you will find yourself eating normally.  There are no restrictions - just eat what your system can tolerate.    MEDICATION:  You may resume all home medications.  You will be given a prescription for pain medication.  Take this as directed for post-operative pain.  If you are experiencing only mild discomfort, you may find that over-the-counter medication, such as Tylenol (acetaminophen) or Advil/Nuprin (ibuprofen), may be all you need for comfort.  If constipation becomes a problem, an over the counter stool softener or laxative may be taken.    WHAT TO  LOOK FOR:  You may notice a slight drainage (usually pink or reddish in color), bruising, or slight swelling around the incision.  This is normal and not a cause for concern.  Likewise, it is normal to have a lump or hardness under or near the incision.  You may also have bruising and some swelling of the genitalia, which is not uncommon.  However, please call our office immediately or go to the ER if you develop any of the following:      -- excessive drainage, bleeding, redness or swelling at or around the incision  -- fever over 101F  -- increased abdominal pain  -- persistent nausea or vomiting  -- difficulty with urination  -- difficulty breathing, chest pain or calf pain    FOLLOW-UP:  We will see you in our office 7 to 10 days after your surgery and again in several weeks.  Prior to surgery, you should have made an appointment for your first post-operative visit.  If for some reason that appointment was not scheduled, please call our office at [phone] as soon as you return home to schedule your appointment.    DIFFICULTIES:  Please call us if problems or questions arise.  We can be reached at any time, including evenings and weekends, by calling our office number [phone].

## 2022-05-05 NOTE — Progress Notes (Addendum)
NURSE NOTE SUMMARY  [place]   Patient Name: Tanya Walters, Tanya Walters   Attending Physician: Barnet Glasgow, MD   Today's date:   05/05/2022 LOS: 1 days   Shift Summary:                                                              NURSE NOTE SUMMARY  [place]   Patient Name: Tanya Walters, Tanya Walters   Attending Physician: Barnet Glasgow, MD   Today's date:   05/05/2022 LOS: 1 days   Shift Summary:                                                                A+0X4  Vitals: WNL  Pain: Pain is manageable on current regimen Managed with prn oxycodone  Dressing: clean, dry & intact  Incision site: silver impregnated textile to left flank  Ambulating: yes (Steady Gait, walker)  Voiding: yes Clear Yellow urine  Passing Gas: yes  BM: no   Drain: JP drain to Left flank, serosanguinous output      GI:  Patient able to tolerate regular diet. Patient had Some nausea, patient states nauseous after taking prn oxycodone.States nausea improved with prn zofran          Provider Notifications:      Rapid Response Notifications:  Mobility:      PMP Activity: Step 7 - Walks out of Room (05/05/2022  6:00 PM)     Weight tracking:  Family Dynamic:   Last 3 Weights for the past 72 hrs (Last 3 readings):   Weight   05/04/22 1327 79.4 kg (175 lb)             Recent Vitals Last Bowel Movement   BP: 99/62 (05/05/2022  3:52 PM)  Heart Rate: 92 (05/05/2022  3:52 PM)  Temp: 98.1 F (36.7 C) (05/05/2022  3:52 PM)  Resp Rate: 14 (05/05/2022 11:04 AM)  Height: 1.575 m (5' 2.01") (05/04/2022  1:27 PM)  Weight: 79.4 kg (175 lb) (05/04/2022  1:27 PM)  SpO2: 93 % (05/05/2022  3:52 PM)   Last BM Date: 05/03/22          Provider Notifications:      Rapid Response Notifications:  Mobility:      PMP Activity: Step 7 - Walks out of Room (05/05/2022  6:00 PM)     Weight tracking:  Family Dynamic:   Last 3 Weights for the past 72 hrs (Last 3 readings):   Weight   05/04/22 1327 79.4 kg (175 lb)             Recent  Vitals Last Bowel Movement   BP: 99/62 (05/05/2022  3:52 PM)  Heart Rate: 92 (05/05/2022  3:52 PM)  Temp: 98.1 F (36.7 C) (05/05/2022  3:52 PM)  Resp Rate: 14 (05/05/2022 11:04 AM)  Height: 1.575 m (5' 2.01") (05/04/2022  1:27 PM)  Weight: 79.4 kg (175 lb) (05/04/2022  1:27 PM)  SpO2: 93 % (05/05/2022  3:52 PM)   Last BM  Date: 05/03/22

## 2022-05-06 MED ORDER — BISACODYL 10 MG RE SUPP
10.0000 mg | Freq: Once | RECTAL | Status: DC
Start: 2022-05-06 — End: 2022-05-06

## 2022-05-06 MED ORDER — MAGNESIUM HYDROXIDE 400 MG/5ML PO SUSP
30.0000 mL | Freq: Once | ORAL | Status: AC
Start: 2022-05-06 — End: 2022-05-06
  Administered 2022-05-06: 30 mL via ORAL
  Filled 2022-05-06: qty 30

## 2022-05-06 NOTE — Plan of Care (Addendum)
NURSE NOTE SUMMARY  [place]   Patient Name: Tanya Walters, NEMMERS Vahle   Attending Physician: Barnet Glasgow, MD   Today's date:   05/06/2022 LOS: 2 days   Shift Summary:                                                                A+0X4  Vitals: WNL  Pain: Pain is well controlled. Managed with oxycodone p.o.  Dressing: clean, dry & intact  Incision site: clean dry and intact.  Ambulating: yes with walker.  Voiding: yes Clear Yellow urine  Passing Gas: yes  BM: no   Drain: JP drain on the left flank.serosanguinous output.  GI:  Patient able to tolerate regular diet. Patient had No nausea.           Provider Notifications:      Rapid Response Notifications:  Mobility:      PMP Activity: Step 7 - Walks out of Room (05/05/2022 11:00 PM)     Weight tracking:  Family Dynamic:   Last 3 Weights for the past 72 hrs (Last 3 readings):   Weight   05/04/22 1327 79.4 kg (175 lb)             Recent Vitals Last Bowel Movement   BP: 132/80 (05/05/2022 11:37 PM)  Heart Rate: 86 (05/05/2022 11:37 PM)  Temp: 98.4 F (36.9 C) (05/05/2022 11:37 PM)  Resp Rate: 16 (05/05/2022 11:37 PM)  SpO2: 93 % (05/05/2022 11:37 PM)   Last BM Date: 05/03/22         Problem: Pain interferes with ability to perform ADL  Goal: Pain at adequate level as identified by patient  Flowsheets (Taken 05/06/2022 0058)  Pain at adequate level as identified by patient:   Identify patient comfort function goal   Assess pain on admission, during daily assessment and/or before any "as needed" intervention(s)   Reassess pain within 30-60 minutes of any procedure/intervention, per Pain Assessment, Intervention, Reassessment (AIR) Cycle   Offer non-pharmacological pain management interventions     Problem: Side Effects from Pain Analgesia  Goal: Patient will experience minimal side effects of analgesic therapy  Flowsheets (Taken 05/06/2022 0058)  Patient will experience minimal side effects of analgesic therapy:   Monitor/assess patient's respiratory  status (RR depth, effort, breath sounds)   Assess for changes in cognitive function   Prevent/manage side effects per LIP orders (i.e. nausea, vomiting, pruritus, constipation, urinary retention, etc.)     Problem: Moderate/High Fall Risk Score >5  Goal: Patient will remain free of falls  Flowsheets (Taken 05/06/2022 0058)  VH High Risk (Greater than 13): ALL REQUIRED MODERATE INTERVENTIONS     Problem: Safety  Goal: Patient will be free from injury during hospitalization  Flowsheets (Taken 05/06/2022 0058)  Patient will be free from injury during hospitalization:   Assess patient's risk for falls and implement fall prevention plan of care per policy   Provide and maintain safe environment   Use appropriate transfer methods  Goal: Patient will be free from infection during hospitalization  Flowsheets (Taken 05/06/2022 0058)  Free from Infection during hospitalization: Assess and monitor for signs and symptoms of infection     Problem: Pain  Goal: Pain at adequate level as identified by patient  Flowsheets (  Taken 05/06/2022 0058)  Pain at adequate level as identified by patient:   Identify patient comfort function goal   Assess pain on admission, during daily assessment and/or before any "as needed" intervention(s)   Reassess pain within 30-60 minutes of any procedure/intervention, per Pain Assessment, Intervention, Reassessment (AIR) Cycle   Offer non-pharmacological pain management interventions     Problem: Discharge Barriers  Goal: Patient will be discharged home or other facility with appropriate resources  Flowsheets (Taken 05/06/2022 0058)  Discharge to home or other facility with appropriate resources:   Provide appropriate patient education   Provide information on available health resources     Problem: Psychosocial and Spiritual Needs  Goal: Demonstrates ability to cope with hospitalization/illness  Flowsheets (Taken 05/06/2022 0058)  Demonstrates ability to cope with hospitalizations/illness:   Provide quiet  environment   Encourage verbalization of feelings/concerns/expectations   Assist patient to identify own strengths and abilities   Encourage patient to set small goals for self       Problem: Discharge Barriers  Goal: Patient will be discharged home or other facility with appropriate resources  Flowsheets (Taken 05/06/2022 0058)  Discharge to home or other facility with appropriate resources:   Provide appropriate patient education   Provide information on available health resources

## 2022-05-06 NOTE — Plan of Care (Signed)
Discharge Note:    AVS printed and given to patient. Provided/ reviewed d/c instructions with patient and her daughter in law. Drsg to L flank and JP drain, CDI. Pt went home with JP drain, education about JP empty and care provided. Instructed to record JP output. Verbalizes understanding. Pain under control. Pt went home in a stable condition. All belonging taken.    Problem: Pain interferes with ability to perform ADL  Goal: Pain at adequate level as identified by patient  Outcome: Adequate for Discharge     Problem: Side Effects from Pain Analgesia  Goal: Patient will experience minimal side effects of analgesic therapy  Outcome: Adequate for Discharge     Problem: Moderate/High Fall Risk Score >5  Goal: Patient will remain free of falls  Outcome: Adequate for Discharge     Problem: Safety  Goal: Patient will be free from injury during hospitalization  Outcome: Adequate for Discharge  Goal: Patient will be free from infection during hospitalization  Outcome: Adequate for Discharge     Problem: Pain  Goal: Pain at adequate level as identified by patient  Outcome: Adequate for Discharge     Problem: Discharge Barriers  Goal: Patient will be discharged home or other facility with appropriate resources  Outcome: Adequate for Discharge     Problem: Psychosocial and Spiritual Needs  Goal: Demonstrates ability to cope with hospitalization/illness  Outcome: Adequate for Discharge     Problem: Compromised Activity/Mobility  Goal: Activity/Mobility Interventions  Outcome: Adequate for Discharge

## 2022-05-08 ENCOUNTER — Telehealth (INDEPENDENT_AMBULATORY_CARE_PROVIDER_SITE_OTHER): Payer: Self-pay

## 2022-05-08 LAB — LAB USE ONLY - HISTORICAL SURGICAL PATHOLOGY

## 2022-05-08 NOTE — Discharge Summary (Signed)
Discharge Summary    Date:05/08/2022   Patient Name: Tanya Walters, Tanya Walters  Attending Physician: Barnet Glasgow, MD      Date of Admission:   05/04/2022    Date of Discharge:     05/06/22    Admitting Diagnosis:   Incisional hernia, without obstruction or gangrene [K43.2]  Incisional hernia [K43.2]      Problems:   Problem Lists:  Patient Active Problem List   Diagnosis    Incisional hernia, without obstruction or gangrene    Incisional hernia         Procedure(s):  REPAIR LEFT FLANK HERNIA  MESH EXCISION (CPT [phone])    4 Days Post-Op  -------------------         Discharge Dx:     Principal Diagnosis (Diagnosis after study, that is chiefly responsible for admission to inpatient status):     Flank hernia    Treatment Team:      Barnet Glasgow, MD      Procedures performed:   No results found.    Procedure(s):  REPAIR LEFT FLANK HERNIA  MESH EXCISION (CPT [phone])      Reason for Admission:     Elective repair recurrent left flank hernia    Hospital Course:     Underwent elective repair of recurrent left flank hernia by open retroperitoneal approach  Discharged home POD2 with drain in place     Condition at Discharge:     Stable      Today:     BP 128/76   Pulse 76   Temp 98.1 F (36.7 C) (Oral)   Resp 16   Ht 1.575 m (5' 2.01")   Wt 79.4 kg (175 lb)   SpO2 92%   BMI 32.00 kg/m   Ranges for the last 24 hours:       Last set of labs   Results       ** No results found for the last 72 hours. **              Micro / Labs / Path pending:     Unresulted Labs       None              Discharge Medications:     Discharge Medication List as of 05/06/2022 12:05 PM        START taking these medications    Details   cyclobenzaprine (FLEXERIL) 10 MG tablet Take 1 tablet (10 mg) by mouth 3 (three) times daily as needed for Muscle spasms, Starting Fri 05/05/2022, Normal      docusate sodium (COLACE) 100 MG capsule Take 1 capsule (100 mg) by mouth 2 (two) times daily, Starting Fri 05/05/2022, Normal      oxyCODONE  (ROXICODONE) 5 MG immediate release tablet Take 1 tablet (5 mg) by mouth every 4 (four) hours as needed for Pain, Starting Fri 05/05/2022, Normal           CONTINUE these medications which have NOT CHANGED    Details   acetaminophen (TYLENOL) 650 MG CR tablet Take 1 tablet (650 mg) by mouth every 8 (eight) hours as needed for Pain, Historical Med      apixaban (ELIQUIS) 5 MG Take 1 tablet (5 mg) by mouth 2 (two) times daily, Historical Med      CALCIUM PO Take 1 tablet by mouth nightly, Historical Med      Cholecalciferol (Vitamin D) 50 MCG (2000 UT) Cap Take 1 capsule by mouth daily,  Historical Med      Cyanocobalamin (VITAMIN B-12 PO) Take 1 tablet by mouth nightly, Historical Med      memantine (NAMENDA) 10 MG tablet Take 1 tablet (10 mg) by mouth 2 (two) times daily, Historical Med      Myrbetriq 25 MG Tablet SR 24 hr Take 1 tablet (25 mg) by mouth nightly, Starting Mon 12/12/2021, Historical Med      oxyCODONE-acetaminophen (PERCOCET) 10-325 MG per tablet Take 1 tablet by mouth every 4 (four) hours as needed for Pain, Historical Med      pantoprazole (PROTONIX) 40 MG tablet Take 1 tablet (40 mg) by mouth daily, Starting Mon 12/12/2021, Historical Med      pravastatin (PRAVACHOL) 40 MG tablet Take 2 tablets (80 mg) by mouth nightly, Historical Med      sertraline (ZOLOFT) 100 MG tablet Take 1 tablet (100 mg) by mouth daily, Starting Thu 12/01/2021, Historical Med      traMADol (ULTRAM) 50 MG tablet Take 1 tablet (50 mg) by mouth every 6 (six) hours as needed for Pain, Historical Med               Discharge Instructions:        Follow-up Information       Barnet Glasgow, MD Follow up on 05/12/2022.    Specialty: Surgery  Contact information:  [address]  [place] [address]  [phone]               Junius Roads, MD .                             Discharge Diet: Low fiber diet    Wound 05/04/22 Surgical Incision Flank Left dermabond (Active)   Date First Assessed/Time First Assessed: 05/04/22 0852   Wound  Type: Surgical Incision  Location: Flank  Wound Location Orientation: Left  Wound Description (Comments): dermabond      Assessments 05/04/2022  9:45 AM 05/05/2022 11:00 PM   Site Description Dry;Intact;Dressing covering site (UTA) Dressing covering site (UTA)   Peri-wound Description Dressing covering site (UTA);Dry;Intact Dressing covering site (UTA)   Closure Approximated, closed and dry Dressing covering site (UTA)   Drainage Amount None None   Treatments Cold Compress --   Dressing -- Silver Impregnated Textile   Dressing Changed New --   Dressing Status Dry;Intact --       No associated orders.          Disposition:  Home or Self Care         Patient Instructions:    Activity:  We recommend quiet activity initially.  You may walk and go up and down stairs.  Your level of discomfort will guide the amount of activity you can do.  You should avoid strenuous activity and heavy lifting greater than 20 lbs for 4 to 6 weeks.  Once you have stopped taking prescription medication and can walk without difficulty, you may drive again.    Care for the incision: If, upon discharge, your wound is well-sealed and without drainage, you may shower.  Leave the white steri-strips on the incision for at least one-week.  The steri-strips may get wet.  No soaking your abdomen for two weeks, including baths, pools, the ocean, or hot tubs.  Gently pat the incision dry after showering.  The wound does not need gauze dressing, unless you wish to apply one.    If there is  some draining, or if the wound has been left open, you will need a gauze dressing over it as it heals.      Diet:  Initially your diet may be decreased; this is normal.  We encourage you to keep up with liquids.  Begin with a light diet and avoid hard to digest foods such as broccoli, cauliflower, fatty foods, and spicy foods, as well as foods with seeds, nuts and popcorn.    Medications:  Resume all home medications as per the medication reconciliation form.  You will be  given a prescription for pain medication. Take this as directed for post-operative pain.  If you are experiencing only mild discomfort, you may find over-the-counter medications, such as Tylenol (acetaminophen) or Advil/Nuprin (Ibuprofen), may be all you need for comfort.  Take stool softeners such as colace, metamucil, or sennokot while taking pain medicine to avoid constipation.    What to look for:  Frequent bowel movements or diarrhea is common.  Generally this will improve over 2 to 3 weeks after surgery.  Call our office if you do not have a bowel movement in 48 hours despite use of stool softeners or if there is blood in your stool.  You may notice a slight drainage or redness around sutures or clips at the incision.  This is normal and not a cause for concern.  However, please call our office immediately if you develop any of the following:    -- Increased abdominal pain  -- Excessive drainage, redness or swelling at or around the incision  -- Fever over 101F  -- Persistent nausea or vomiting  -- Difficulty with urination  -- Difficulty breathing, chest pain or calf pain   -- Constipation which does not improve over 72 hours despite intervention    Follow-up:  You will be seen in our office 7 to 10 days after your surgery.  Prior to surgery, you should have made an appointment for your  post-operative visit.  If for some reason that appointment was not scheduled, please call our office at [phone] as soon as your return home to schedule your appointment.    Difficulties:  Please call us if any problems or questions arise.  We can be reached any time, including evenings and weekends, by calling our office number [phone].      Signed by: Barnet Glasgow, MD      [facility]  [phone]

## 2022-05-08 NOTE — Telephone Encounter (Signed)
S/p repair left flank hernia and mesh excision on 05/04/22 with Dr. Sydell Axon. Per op note "A 15 [place] Blake drain was placed." Today, pt's daughter-in-law (HIPAA) calls regarding follow-up instruction. They were told to f/u on 05/12/22 as well as 05/16/22. They ask if the drain will be removed on 02/09. Per chart, confirmed appt on schedule is on 05/16/22. She reports pt is currently staying with them to help care for the drain, that patient lives 4 hours away and would need drain removed before she can return home alone. She asks, can pt just be seen for one visit- whether that be the scheduled 02/13 for the postop or sooner on 02/09 for the drain removal. They would like a callback on follow-up visit clarification. Msg to Dr. Sydell Axon.   Per Dr. Sydell Axon, [place] to come in for visit on 02/09 for drain removal and cancel 02/13 appointment. Msg to scheduling team and call to let pt know. No further question or concern at this time.

## 2022-05-10 ENCOUNTER — Telehealth: Payer: Self-pay

## 2022-05-10 NOTE — Telephone Encounter (Signed)
S/p left flank hernia repair with Dr. Sydell Axon on 2/1. Pt had JP drain placed and it fell out this morning.     Output measurement:  Feb 4=90 ml  Feb 5=60 ml  Feb 6=40 ml    Informed Dr. Sydell Axon, recommended pt cover incision with a dry dressing and tape. Output is decreasing which is good.     Notified pt of above instructions and pt will follow up on 05/12/2022 with Dr. Sydell Axon in office.     Pt and daughter in law verbalized understanding.

## 2022-05-12 ENCOUNTER — Encounter: Payer: Self-pay | Admitting: Surgery

## 2022-05-12 ENCOUNTER — Ambulatory Visit: Payer: Medicare PPO | Admitting: Surgery

## 2022-05-12 VITALS — BP 128/75 | HR 66 | Temp 97.7°F | Wt 137.0 lb

## 2022-05-12 DIAGNOSIS — Z9889 Other specified postprocedural states: Secondary | ICD-10-CM

## 2022-05-12 DIAGNOSIS — Z8719 Personal history of other diseases of the digestive system: Secondary | ICD-10-CM

## 2022-05-12 DIAGNOSIS — K432 Incisional hernia without obstruction or gangrene: Secondary | ICD-10-CM

## 2022-05-12 NOTE — Progress Notes (Signed)
General Surgery        Visit Date Time: 05/12/2022  11:19 AM     Referring Provider:  Dr. Missy Sabins  [facility] Provider: Lianne Moris, MD, FACS    POSTOP NOTE:    Reason for visit:  Here for follow up    Tanya Walters is a 77 y.o. female who  has a past medical history of Arthritis, Chronic obstructive pulmonary disease, COVID-19 vaccine administered, COVID-19 vaccine series started, Deep venous thrombosis of distal lower extremity, Dementia, Depression, Dyspnea on exertion (11/2021), Fall (03/29/2022), Gastroesophageal reflux disease, Incisional hernia without obstruction or gangrene, Nasal fracture (03/29/2022), Post-operative nausea and vomiting, Seasonal allergic rhinitis, and Sleep apnea..  She  presents after recent surgery.  She had REPAIR LEFT FLANK HERNIA; MESH EXCISION by Dr. Sydell Axon on 05/04/2022 at [facility].     Pathology: In chart    She is healing acceptably and globally improving following left flank repair by open retroperitoneal approach.  Following surgery, her drain became dislodged several days ago.  She feels well without acute complaints or concerns.    The patient has not been to the ED since surgery/last visit.  The patient did not require readmission since surgery/last visit.    Additional issues: None.    Past Medical History:     Past Medical History:   Diagnosis Date    Arthritis     Chronic obstructive pulmonary disease     possible dx years ago; recent pulmonary workup was negative per daughter    COVID-19 vaccine administered     3x moderna    COVID-19 vaccine series started     x2? unknown brand    Deep venous thrombosis of distal lower extremity     years ago, unprovoked, pt on Eliquis    Dementia     early onset, alert to person, place and time, signs own consent    Depression     Dyspnea on exertion 11/2021    per daughter, pt no longer having sob    Fall 03/29/2022    bending over to pick something up and lost balance, broke nose    Gastroesophageal reflux disease     Incisional  hernia without obstruction or gangrene     preop dx    Nasal fracture 03/29/2022    from fall    Post-operative nausea and vomiting     mild    Seasonal allergic rhinitis     Sleep apnea     uses cpap intermittently       Past Surgical History:     Past Surgical History:   Procedure Laterality Date    APPENDECTOMY (OPEN)  as a child    bladder stimulator implant      COLONOSCOPY      FOOT SURGERY Bilateral     HERNIA REPAIR  10/05/2021    HERNIORRHAPY, INCISIONAL Left 05/04/2022    Procedure: REPAIR LEFT [place];  Surgeon: Barnet Glasgow, MD;  Location: [facility];  Service: General;  Laterality: Left;    HYSTERECTOMY  mid 1990's    REMOVAL, STERNAL WIRE N/A 05/04/2022    Procedure: MESH EXCISION (CPT [id number]);  Surgeon: Barnet Glasgow, MD;  Location: [facility];  Service: General;  Laterality: N/A;    [place]  2019    fusion       Family History:     Family History   Problem Relation Age of Onset    Heart disease Father  died at age 25    Diabetes Sister     Diabetes Brother     Heart disease Brother        Social History:     Social History     Socioeconomic History    Marital status: Widowed   Tobacco Use    Smoking status: Former     Packs/day: .5     Types: Cigarettes     Quit date: 04/03/2004     Years since quitting: 18.1    Smokeless tobacco: Never    Tobacco comments:     Secondhand smoke; occasional smoker   Vaping Use    Vaping Use: Never used   Substance and Sexual Activity    Alcohol use: Never    Drug use: Never    Sexual activity: Never     Social Determinants of Health     Financial Resource Strain: Low Risk  (12/12/2021)    Overall Financial Resource Strain (CARDIA)     Difficulty of Paying Living Expenses: Not hard at all   Food Insecurity: No Food Insecurity (12/12/2021)    Hunger Vital Sign     Worried About Running Out of Food in the Last Year: Never true     Ran Out of Food in the Last Year: Never true   Transportation Needs: No Transportation Needs (12/12/2021)     PRAPARE - Armed forces logistics/support/administrative officer (Medical): No     Lack of Transportation (Non-Medical): No   Physical Activity: Inactive (12/12/2021)    Exercise Vital Sign     Days of Exercise per Week: 0 days     Minutes of Exercise per Session: 0 min   Social Connections: Socially Integrated (12/12/2021)    Social Connection and Isolation Panel [NHANES]     Frequency of Communication with Friends and Family: Twice a week     Frequency of Social Gatherings with Friends and Family: Once a week     Attends Religious Services: More than 4 times per year     Active Member of [employer] or Organizations: Yes     Attends Music therapist: More than 4 times per year     Marital Status: Married   Human resources officer Violence: Not At Risk (12/12/2021)    Humiliation, Afraid, Rape, and Kick questionnaire     Fear of Current or Ex-Partner: No     Emotionally Abused: No     Physically Abused: No     Sexually Abused: No   Housing Stability: Low Risk  (12/12/2021)    Housing Stability Vital Sign     Unable to Pay for Housing in the Last Year: No     Number of Places Lived in the Last Year: 1     Unstable Housing in the Last Year: No       Allergies:   Hydrocortisone, Penicillins, and Toviaz [fesoterodine]    Medications:     Current Outpatient Medications   Medication Sig Dispense Refill    acetaminophen (TYLENOL) 650 MG CR tablet Take 1 tablet (650 mg) by mouth every 8 (eight) hours as needed for Pain      apixaban (ELIQUIS) 5 MG Take 1 tablet (5 mg) by mouth 2 (two) times daily      CALCIUM PO Take 1 tablet by mouth nightly      Cholecalciferol (Vitamin D) 50 MCG (2000 UT) Cap Take 1 capsule by mouth daily      Cyanocobalamin (VITAMIN B-12 PO)  Take 1 tablet by mouth nightly      memantine (NAMENDA) 10 MG tablet Take 1 tablet (10 mg) by mouth 2 (two) times daily      Myrbetriq 25 MG Tablet SR 24 hr Take 1 tablet (25 mg) by mouth nightly      pantoprazole (PROTONIX) 40 MG tablet Take 1 tablet (40 mg) by mouth daily       pravastatin (PRAVACHOL) 40 MG tablet Take 2 tablets (80 mg) by mouth nightly      sertraline (ZOLOFT) 100 MG tablet Take 1 tablet (100 mg) by mouth daily      traMADol (ULTRAM) 50 MG tablet Take 1 tablet (50 mg) by mouth every 6 (six) hours as needed for Pain      cyclobenzaprine (FLEXERIL) 10 MG tablet Take 1 tablet (10 mg) by mouth 3 (three) times daily as needed for Muscle spasms (Patient not taking: Reported on 05/12/2022) 20 tablet 0    docusate sodium (COLACE) 100 MG capsule Take 1 capsule (100 mg) by mouth 2 (two) times daily (Patient not taking: Reported on 05/12/2022) 14 capsule 0    oxyCODONE (ROXICODONE) 5 MG immediate release tablet Take 1 tablet (5 mg) by mouth every 4 (four) hours as needed for Pain (Patient not taking: Reported on 05/12/2022) 8 tablet 0    oxyCODONE-acetaminophen (PERCOCET) 10-325 MG per tablet Take 1 tablet by mouth every 4 (four) hours as needed for Pain (Patient not taking: Reported on 05/12/2022)       No current facility-administered medications for this visit.       Review of Systems:     ROS   As per the HPI and above. The patient otherwise denies any additional changes to their otic, opthalmologic, dermatologic, pulmonary, cardiac, gastrointestinal, genitourinary, musculoskeletal, hematologic, constitutional, or psychiatric systems.      Physical Exam:     Vitals:    05/12/22 1020   BP: 128/75   Pulse:    Temp:    SpO2:        Focused physical exam    General apppears well  Wound healing appropriately       Assessment:     Patient Active Problem List   Diagnosis    Incisional hernia, without obstruction or gangrene    Incisional hernia     1. History of hernia repair       Plan:     Postop instructions: wound care instructions given (no further care)    Doing well following left flank hernia reapir without postoperative complications or concerns    Discussed activity and dietary restrictions, all questions answered    Clinic follow-up as needed      Follow up: as needed    Thank you  for sending this patient to [place] for surgical treatment.  We appreciate the opportunity to participate in their care.  Their surgical issue has been addressed, and they will be returned to your care and/or their PCP for any ongoing issues.  If you have any questions, do not hesitate to contact us at [phone].        Memory Argue Sydell Axon, MD, FACS  [facility]

## 2022-05-16 ENCOUNTER — Ambulatory Visit: Payer: Medicare PPO | Admitting: Surgery

## 2022-06-27 NOTE — Addendum Note (Signed)
Addendum  created 06/27/22 1047 by Eino Farber, MD    Clinical Note Signed
